# Patient Record
Sex: Female | Born: 1960 | Race: White | Hispanic: No | Marital: Married | Smoking: Former smoker
Health system: Southern US, Community
[De-identification: ages and names within clinical notes are randomized; demographics above are authoritative.]

## PROBLEM LIST (undated history)

## (undated) DIAGNOSIS — N63 Unspecified lump in unspecified breast: Secondary | ICD-10-CM

## (undated) DIAGNOSIS — T7840XA Allergy, unspecified, initial encounter: Secondary | ICD-10-CM

## (undated) HISTORY — PX: TONSILECTOMY/ADENOIDECTOMY WITH MYRINGOTOMY: SHX6125

## (undated) HISTORY — DX: Allergy, unspecified, initial encounter: T78.40XA

## (undated) HISTORY — PX: OTHER SURGICAL HISTORY: SHX169

## (undated) HISTORY — PX: BREAST CYST EXCISION: SHX579

## (undated) HISTORY — DX: Unspecified lump in unspecified breast: N63.0

## (undated) HISTORY — PX: DILATION AND CURETTAGE OF UTERUS: SHX78

---

## 1997-09-14 ENCOUNTER — Ambulatory Visit (HOSPITAL_COMMUNITY): Admission: RE | Admit: 1997-09-14 | Discharge: 1997-09-14 | Payer: Self-pay | Admitting: Obstetrics and Gynecology

## 1998-06-04 ENCOUNTER — Other Ambulatory Visit: Admission: RE | Admit: 1998-06-04 | Discharge: 1998-06-04 | Payer: Self-pay | Admitting: Obstetrics and Gynecology

## 1998-12-16 ENCOUNTER — Encounter: Payer: Self-pay | Admitting: Obstetrics and Gynecology

## 1998-12-16 ENCOUNTER — Observation Stay (HOSPITAL_COMMUNITY): Admission: AD | Admit: 1998-12-16 | Discharge: 1998-12-17 | Payer: Self-pay | Admitting: Obstetrics and Gynecology

## 1998-12-25 ENCOUNTER — Inpatient Hospital Stay (HOSPITAL_COMMUNITY): Admission: AD | Admit: 1998-12-25 | Discharge: 1998-12-26 | Payer: Self-pay | Admitting: Obstetrics and Gynecology

## 1999-01-28 ENCOUNTER — Other Ambulatory Visit: Admission: RE | Admit: 1999-01-28 | Discharge: 1999-01-28 | Payer: Self-pay | Admitting: Obstetrics and Gynecology

## 2001-02-21 ENCOUNTER — Other Ambulatory Visit: Admission: RE | Admit: 2001-02-21 | Discharge: 2001-02-21 | Payer: Self-pay | Admitting: Obstetrics and Gynecology

## 2002-03-13 ENCOUNTER — Other Ambulatory Visit: Admission: RE | Admit: 2002-03-13 | Discharge: 2002-03-13 | Payer: Self-pay | Admitting: Obstetrics and Gynecology

## 2003-03-20 ENCOUNTER — Other Ambulatory Visit: Admission: RE | Admit: 2003-03-20 | Discharge: 2003-03-20 | Payer: Self-pay | Admitting: Obstetrics and Gynecology

## 2004-01-28 ENCOUNTER — Ambulatory Visit: Payer: Self-pay | Admitting: Internal Medicine

## 2004-09-22 ENCOUNTER — Ambulatory Visit: Payer: Self-pay | Admitting: Internal Medicine

## 2004-10-01 ENCOUNTER — Ambulatory Visit: Payer: Self-pay | Admitting: Internal Medicine

## 2004-10-15 ENCOUNTER — Ambulatory Visit: Payer: Self-pay | Admitting: Family Medicine

## 2004-11-09 ENCOUNTER — Ambulatory Visit: Payer: Self-pay | Admitting: Internal Medicine

## 2004-11-20 ENCOUNTER — Ambulatory Visit: Payer: Self-pay | Admitting: Internal Medicine

## 2004-12-01 ENCOUNTER — Encounter: Admission: RE | Admit: 2004-12-01 | Discharge: 2004-12-01 | Payer: Self-pay | Admitting: Internal Medicine

## 2005-01-18 ENCOUNTER — Ambulatory Visit: Payer: Self-pay | Admitting: Family Medicine

## 2005-05-13 ENCOUNTER — Encounter: Admission: RE | Admit: 2005-05-13 | Discharge: 2005-05-13 | Payer: Self-pay | Admitting: Obstetrics and Gynecology

## 2005-12-03 ENCOUNTER — Encounter: Admission: RE | Admit: 2005-12-03 | Discharge: 2005-12-03 | Payer: Self-pay | Admitting: Obstetrics and Gynecology

## 2006-04-15 ENCOUNTER — Ambulatory Visit: Payer: Self-pay | Admitting: Internal Medicine

## 2006-05-05 ENCOUNTER — Ambulatory Visit: Payer: Self-pay | Admitting: Internal Medicine

## 2006-05-17 ENCOUNTER — Encounter: Admission: RE | Admit: 2006-05-17 | Discharge: 2006-05-17 | Payer: Self-pay | Admitting: Internal Medicine

## 2007-02-15 ENCOUNTER — Encounter: Payer: Self-pay | Admitting: Internal Medicine

## 2007-02-15 ENCOUNTER — Ambulatory Visit: Payer: Self-pay | Admitting: Internal Medicine

## 2007-04-03 ENCOUNTER — Ambulatory Visit: Payer: Self-pay | Admitting: Internal Medicine

## 2007-04-03 LAB — CONVERTED CEMR LAB
ALT: 15 units/L (ref 0–35)
Alkaline Phosphatase: 57 units/L (ref 39–117)
BUN: 16 mg/dL (ref 6–23)
Basophils Relative: 0 % (ref 0.0–1.0)
Bilirubin Urine: NEGATIVE
Blood in Urine, dipstick: NEGATIVE
CO2: 27 meq/L (ref 19–32)
Calcium: 9.1 mg/dL (ref 8.4–10.5)
Chloride: 102 meq/L (ref 96–112)
Creatinine, Ser: 0.8 mg/dL (ref 0.4–1.2)
Glucose, Urine, Semiquant: NEGATIVE
HDL: 82 mg/dL (ref >39.0)
Hemoglobin: 13.4 g/dL (ref 12.0–15.0)
LDL Cholesterol: 76 mg/dL (ref 0–99)
Monocytes Relative: 6.2 % (ref 3.0–11.0)
Platelets: 286 10*3/uL (ref 150–400)
RDW: 11.6 % (ref 11.5–14.6)
Specific Gravity, Urine: 1.02
Total Bilirubin: 0.6 mg/dL (ref 0.3–1.2)
Total Protein: 6.2 g/dL (ref 6.0–8.3)
Triglycerides: 37 mg/dL (ref 0–149)
VLDL: 7 mg/dL (ref 0–40)
WBC Urine, dipstick: NEGATIVE
pH: 7

## 2007-04-10 ENCOUNTER — Ambulatory Visit: Payer: Self-pay | Admitting: Internal Medicine

## 2007-04-10 DIAGNOSIS — J309 Allergic rhinitis, unspecified: Secondary | ICD-10-CM | POA: Insufficient documentation

## 2007-04-10 DIAGNOSIS — M949 Disorder of cartilage, unspecified: Secondary | ICD-10-CM

## 2007-04-10 DIAGNOSIS — M899 Disorder of bone, unspecified: Secondary | ICD-10-CM | POA: Insufficient documentation

## 2007-04-10 DIAGNOSIS — N63 Unspecified lump in unspecified breast: Secondary | ICD-10-CM

## 2007-04-10 HISTORY — DX: Unspecified lump in unspecified breast: N63.0

## 2007-04-10 HISTORY — DX: Allergic rhinitis, unspecified: J30.9

## 2007-04-10 LAB — CONVERTED CEMR LAB: Vit D, 1,25-Dihydroxy: 48 (ref 30–89)

## 2007-04-28 ENCOUNTER — Ambulatory Visit: Payer: Self-pay | Admitting: Internal Medicine

## 2007-04-28 LAB — CONVERTED CEMR LAB: Rapid Strep: NEGATIVE

## 2007-04-29 ENCOUNTER — Encounter: Payer: Self-pay | Admitting: Internal Medicine

## 2007-05-01 ENCOUNTER — Ambulatory Visit: Payer: Self-pay | Admitting: Internal Medicine

## 2007-05-04 ENCOUNTER — Telehealth (INDEPENDENT_AMBULATORY_CARE_PROVIDER_SITE_OTHER): Payer: Self-pay | Admitting: *Deleted

## 2007-05-09 ENCOUNTER — Telehealth (INDEPENDENT_AMBULATORY_CARE_PROVIDER_SITE_OTHER): Payer: Self-pay | Admitting: *Deleted

## 2007-05-10 ENCOUNTER — Encounter: Admission: RE | Admit: 2007-05-10 | Discharge: 2007-05-10 | Payer: Self-pay | Admitting: Internal Medicine

## 2007-05-12 ENCOUNTER — Encounter: Payer: Self-pay | Admitting: Internal Medicine

## 2007-06-13 ENCOUNTER — Encounter: Admission: RE | Admit: 2007-06-13 | Discharge: 2007-06-13 | Payer: Self-pay | Admitting: Internal Medicine

## 2007-07-03 ENCOUNTER — Ambulatory Visit: Payer: Self-pay | Admitting: Internal Medicine

## 2007-07-03 DIAGNOSIS — K589 Irritable bowel syndrome without diarrhea: Secondary | ICD-10-CM

## 2007-07-03 DIAGNOSIS — K219 Gastro-esophageal reflux disease without esophagitis: Secondary | ICD-10-CM | POA: Insufficient documentation

## 2007-07-03 HISTORY — DX: Irritable bowel syndrome, unspecified: K58.9

## 2007-07-17 ENCOUNTER — Encounter: Payer: Self-pay | Admitting: Internal Medicine

## 2007-07-20 ENCOUNTER — Encounter (INDEPENDENT_AMBULATORY_CARE_PROVIDER_SITE_OTHER): Payer: Self-pay | Admitting: Surgery

## 2007-07-20 ENCOUNTER — Ambulatory Visit (HOSPITAL_BASED_OUTPATIENT_CLINIC_OR_DEPARTMENT_OTHER): Admission: RE | Admit: 2007-07-20 | Discharge: 2007-07-20 | Payer: Self-pay | Admitting: Surgery

## 2007-07-31 ENCOUNTER — Encounter: Payer: Self-pay | Admitting: Internal Medicine

## 2007-10-11 ENCOUNTER — Telehealth (INDEPENDENT_AMBULATORY_CARE_PROVIDER_SITE_OTHER): Payer: Self-pay | Admitting: *Deleted

## 2007-12-28 ENCOUNTER — Ambulatory Visit: Payer: Self-pay | Admitting: Internal Medicine

## 2008-01-09 ENCOUNTER — Ambulatory Visit: Payer: Self-pay | Admitting: Internal Medicine

## 2008-06-24 ENCOUNTER — Encounter: Admission: RE | Admit: 2008-06-24 | Discharge: 2008-06-24 | Payer: Self-pay | Admitting: Internal Medicine

## 2008-10-22 ENCOUNTER — Ambulatory Visit: Payer: Self-pay | Admitting: Internal Medicine

## 2008-10-22 LAB — CONVERTED CEMR LAB
ALT: 16 units/L (ref 0–35)
Basophils Absolute: 0 10*3/uL (ref 0.0–0.1)
Bilirubin, Direct: 0.1 mg/dL (ref 0.0–0.3)
CO2: 31 meq/L (ref 19–32)
Eosinophils Absolute: 0.4 10*3/uL (ref 0.0–0.7)
Eosinophils Relative: 9 % — ABNORMAL HIGH (ref 0.0–5.0)
FSH: 84.4 milliintl units/mL
GFR calc non Af Amer: 70.33 mL/min (ref >60)
Glucose, Bld: 91 mg/dL (ref 70–99)
HDL: 78.5 mg/dL (ref >39.00)
Leukocytes, UA: NEGATIVE
MCV: 94.8 fL (ref 78.0–100.0)
Monocytes Absolute: 0.3 10*3/uL (ref 0.1–1.0)
Neutrophils Relative %: 39.8 % — ABNORMAL LOW (ref 43.0–77.0)
Nitrite: NEGATIVE
Platelets: 266 10*3/uL (ref 150.0–400.0)
Potassium: 4.8 meq/L (ref 3.5–5.1)
RDW: 11.3 % — ABNORMAL LOW (ref 11.5–14.6)
Sodium: 144 meq/L (ref 135–145)
Specific Gravity, Urine: 1.01 (ref 1.000–1.030)
Total Bilirubin: 0.8 mg/dL (ref 0.3–1.2)
Urobilinogen, UA: 0.2 (ref 0.0–1.0)
VLDL: 8.2 mg/dL (ref 0.0–40.0)
WBC: 4.5 10*3/uL (ref 4.5–10.5)
pH: 7.5 (ref 5.0–8.0)

## 2008-11-05 ENCOUNTER — Ambulatory Visit: Payer: Self-pay | Admitting: Internal Medicine

## 2008-12-03 ENCOUNTER — Telehealth: Payer: Self-pay | Admitting: Internal Medicine

## 2009-06-25 ENCOUNTER — Encounter: Admission: RE | Admit: 2009-06-25 | Discharge: 2009-06-25 | Payer: Self-pay | Admitting: Obstetrics and Gynecology

## 2009-07-18 ENCOUNTER — Encounter: Payer: Self-pay | Admitting: Internal Medicine

## 2009-07-18 ENCOUNTER — Ambulatory Visit: Payer: Self-pay | Admitting: Internal Medicine

## 2010-04-05 ENCOUNTER — Encounter: Payer: Self-pay | Admitting: Internal Medicine

## 2010-04-14 NOTE — Miscellaneous (Signed)
Summary: BONE DENSITY  Clinical Lists Changes  Orders: Added new Test order of T-Bone Densitometry (77080) - Signed Added new Test order of T-Lumbar Vertebral Assessment (77082) - Signed 

## 2010-06-09 ENCOUNTER — Other Ambulatory Visit: Payer: Self-pay | Admitting: Internal Medicine

## 2010-06-09 DIAGNOSIS — Z1231 Encounter for screening mammogram for malignant neoplasm of breast: Secondary | ICD-10-CM

## 2010-06-29 ENCOUNTER — Ambulatory Visit
Admission: RE | Admit: 2010-06-29 | Discharge: 2010-06-29 | Disposition: A | Discharge status: Home / Self Care | Payer: Federal, State, Local not specified - PPO | Source: Ambulatory Visit | Attending: Internal Medicine | Admitting: Internal Medicine

## 2010-06-29 DIAGNOSIS — Z1231 Encounter for screening mammogram for malignant neoplasm of breast: Secondary | ICD-10-CM

## 2010-07-28 NOTE — Op Note (Signed)
NAME:  Joan Edwards, Joan Edwards NO.:  [id number]   MEDICAL RECORD NO.:  [id number]          PATIENT TYPE:  AMB   LOCATION:  DSC                          FACILITY:  [facility]   PHYSICIAN:  Currie Paris, M.D.DATE OF BIRTH:  02-25-1961   DATE OF PROCEDURE:  07/20/2007  DATE OF DISCHARGE:                               OPERATIVE REPORT   PREOPERATIVE DIAGNOSIS:  Left breast mass.   POSTOPERATIVE DIAGNOSIS:  Left breast mass.   OPERATION:  Excisional biopsy left breast mass.   SURGEON:  Currie Paris, M.D.   ANESTHESIA:  General.   CLINICAL HISTORY:  This is a 50 year old lady with a small soft mass in  the upper outer quadrant of left breast really at the edge of the  pectoralis and lower axilla.  Clinically, it was benign.  It was not  apparent on diagnostic imaging.  Initially, we elected to follow this,  but the patient became anxious and wished to have this removed.   DESCRIPTION OF THE PROCEDURE:  The patient was seen in the holding area,  and she had no further questions.  We confirmed that excision of her  left breast mass was the planned procedure and we both initialled the  left breast as the operative site.   The patient was taken to the operating room and before being given any  IV sedation she was able to locate the mass as I was and I was able to  mark the exact location fairly anterior low axilla.  Again, this was  felt to be clinically benign, and I thought perhaps a lipoma.   After satisfactory general (LMA), anesthesia had been obtained.  The  breast was prepped and draped and the time-out was performed.   I made a short incision directly over the mass and did some general  dissection and found a well-circumscribed lipoma appearing mass that was  excised in toto.  With that out careful palpation, bimanually with my  one finger in the incision and one on the skin and then deeper into the  axilla revealed no other mass and I think this was  consistent with  malignant feeling.  There really did not appear to be breast tissue in  this area, but only fatty accessory tissue.   After everything was tried and I was sure we gotten the mass in question  out, I went ahead and infiltrate this with some 0.25% plain Marcaine.  The incision was closed with 3-0 Vicryl, 4-0 Monocryl subcuticular, and  Dermabond on the skin.   The patient tolerated the procedure well, and there were no  complications.  All counts were correct.      Currie Paris, M.D.  Electronically Signed     CJS/MEDQ  D:  07/20/2007  T:  07/20/2007  Job:  [id number]   cc:   Stacie Glaze, MD

## 2010-08-25 ENCOUNTER — Other Ambulatory Visit: Payer: Self-pay | Admitting: Obstetrics and Gynecology

## 2011-06-01 ENCOUNTER — Other Ambulatory Visit: Payer: Self-pay | Admitting: Obstetrics and Gynecology

## 2011-06-01 DIAGNOSIS — Z1231 Encounter for screening mammogram for malignant neoplasm of breast: Secondary | ICD-10-CM

## 2011-07-01 ENCOUNTER — Ambulatory Visit
Admission: RE | Admit: 2011-07-01 | Discharge: 2011-07-01 | Disposition: A | Discharge status: Home / Self Care | Payer: Federal, State, Local not specified - PPO | Source: Ambulatory Visit | Attending: Obstetrics and Gynecology | Admitting: Obstetrics and Gynecology

## 2011-07-01 DIAGNOSIS — Z1231 Encounter for screening mammogram for malignant neoplasm of breast: Secondary | ICD-10-CM

## 2011-09-24 ENCOUNTER — Telehealth: Payer: Self-pay | Admitting: Internal Medicine

## 2011-09-24 NOTE — Telephone Encounter (Signed)
Open in error

## 2011-12-10 ENCOUNTER — Other Ambulatory Visit (INDEPENDENT_AMBULATORY_CARE_PROVIDER_SITE_OTHER): Payer: Federal, State, Local not specified - PPO

## 2011-12-10 DIAGNOSIS — Z0000 Encounter for general adult medical examination without abnormal findings: Secondary | ICD-10-CM

## 2011-12-10 LAB — HEPATIC FUNCTION PANEL
ALT: 14 U/L (ref 0–35)
AST: 18 U/L (ref 0–37)
Alkaline Phosphatase: 58 U/L (ref 39–117)
Total Bilirubin: 0.6 mg/dL (ref 0.3–1.2)

## 2011-12-10 LAB — CBC WITH DIFFERENTIAL/PLATELET
Basophils Absolute: 0 10*3/uL (ref 0.0–0.1)
Eosinophils Relative: 6.6 % — ABNORMAL HIGH (ref 0.0–5.0)
HCT: 38.7 % (ref 36.0–46.0)
Hemoglobin: 12.7 g/dL (ref 12.0–15.0)
Lymphocytes Relative: 47 % — ABNORMAL HIGH (ref 12.0–46.0)
Lymphs Abs: 2.1 10*3/uL (ref 0.7–4.0)
Monocytes Relative: 5.5 % (ref 3.0–12.0)
Neutro Abs: 1.8 10*3/uL (ref 1.4–7.7)
Platelets: 274 10*3/uL (ref 150.0–400.0)
WBC: 4.5 10*3/uL (ref 4.5–10.5)

## 2011-12-10 LAB — LIPID PANEL
HDL: 91 mg/dL (ref >39.00)
Total CHOL/HDL Ratio: 2
VLDL: 7.2 mg/dL (ref 0.0–40.0)

## 2011-12-10 LAB — BASIC METABOLIC PANEL
Calcium: 9.2 mg/dL (ref 8.4–10.5)
GFR: 83.17 mL/min (ref >60.00)
Potassium: 4.9 mEq/L (ref 3.5–5.1)
Sodium: 141 mEq/L (ref 135–145)

## 2011-12-10 LAB — TSH: TSH: 2.07 u[IU]/mL (ref 0.35–5.50)

## 2011-12-17 ENCOUNTER — Ambulatory Visit (INDEPENDENT_AMBULATORY_CARE_PROVIDER_SITE_OTHER): Payer: Federal, State, Local not specified - PPO | Admitting: Internal Medicine

## 2011-12-17 ENCOUNTER — Encounter: Payer: Self-pay | Admitting: Internal Medicine

## 2011-12-17 VITALS — BP 120/70 | HR 72 | Temp 98.3°F | Resp 16 | Ht 64.0 in | Wt 136.0 lb

## 2011-12-17 DIAGNOSIS — Z0000 Encounter for general adult medical examination without abnormal findings: Secondary | ICD-10-CM

## 2011-12-17 NOTE — Progress Notes (Signed)
Subjective:    Patient ID: Joan Edwards, female    DOB: 03/12/1961, 51 y.o.   MRN: [id number]  HPI CPX   Review of Systems  Constitutional: Negative for activity change, appetite change and fatigue.  HENT: Negative for ear pain, congestion, neck pain, postnasal drip and sinus pressure.   Eyes: Negative for redness and visual disturbance.  Respiratory: Negative for cough, shortness of breath and wheezing.   Gastrointestinal: Negative for abdominal pain and abdominal distention.  Genitourinary: Negative for dysuria, frequency and menstrual problem.  Musculoskeletal: Negative for myalgias, joint swelling and arthralgias.  Skin: Negative for rash and wound.  Neurological: Negative for dizziness, weakness and headaches.  Hematological: Negative for adenopathy. Does not bruise/bleed easily.  Psychiatric/Behavioral: Negative for disturbed wake/sleep cycle and decreased concentration.   No past medical history on file.  History   Social History  . Marital Status: Married    Spouse Name: N/A    Number of Children: N/A  . Years of Education: N/A   Occupational History  . Not on file.   Social History Main Topics  . Smoking status: Not on file  . Smokeless tobacco: Not on file  . Alcohol Use: Not on file  . Drug Use: Not on file  . Sexually Active: Not on file   Other Topics Concern  . Not on file   Social History Narrative  . No narrative on file    No past surgical history on file.  No family history on file.  Allergies  Allergen Reactions  . Erythromycin     REACTION: Upset GI  . Sulfonamide Derivatives     REACTION: itching    No current outpatient prescriptions on file prior to visit.    BP 120/70  Pulse 72  Temp 98.3 F (36.8 C)  Resp 16  Ht 5\' 4"  (1.626 m)  Wt 136 lb (61.689 kg)  BMI 23.34 kg/m2       Objective:   Physical Exam  Nursing note and vitals reviewed. Constitutional: She is oriented to person, place, and time. She appears  well-developed and well-nourished. No distress.  HENT:  Head: Normocephalic and atraumatic.  Right Ear: External ear normal.  Left Ear: External ear normal.  Nose: Nose normal.  Mouth/Throat: Oropharynx is clear and moist.  Eyes: Conjunctivae normal and EOM are normal. Pupils are equal, round, and reactive to light.  Neck: Normal range of motion. Neck supple. No JVD present. No tracheal deviation present. No thyromegaly present.  Cardiovascular: Normal rate, regular rhythm, normal heart sounds and intact distal pulses.   No murmur heard. Pulmonary/Chest: Effort normal and breath sounds normal. She has no wheezes. She exhibits no tenderness.  Abdominal: Soft. Bowel sounds are normal.  Musculoskeletal: Normal range of motion. She exhibits no edema and no tenderness.  Lymphadenopathy:    She has no cervical adenopathy.  Neurological: She is alert and oriented to person, place, and time. She has normal reflexes. No cranial nerve deficit.  Skin: Skin is warm and dry. She is not diaphoretic.  Psychiatric: She has a normal mood and affect. Her behavior is normal.          Assessment & Plan:   This is a routine physical examination for this healthy  Female. Reviewed all health maintenance protocols including mammography colonoscopy bone density and reviewed appropriate screening labs. Her immunization history was reviewed as well as her current medications and allergies refills of her chronic medications were given and the plan for yearly health maintenance  was discussed all orders and referrals were made as appropriate.

## 2012-03-01 ENCOUNTER — Ambulatory Visit (INDEPENDENT_AMBULATORY_CARE_PROVIDER_SITE_OTHER): Payer: Federal, State, Local not specified - PPO | Admitting: Family Medicine

## 2012-03-01 ENCOUNTER — Encounter: Payer: Self-pay | Admitting: Family Medicine

## 2012-03-01 VITALS — BP 100/62 | Temp 98.5°F | Wt 140.0 lb

## 2012-03-01 DIAGNOSIS — R52 Pain, unspecified: Secondary | ICD-10-CM

## 2012-03-01 DIAGNOSIS — B9789 Other viral agents as the cause of diseases classified elsewhere: Secondary | ICD-10-CM

## 2012-03-01 DIAGNOSIS — B349 Viral infection, unspecified: Secondary | ICD-10-CM

## 2012-03-01 LAB — POCT INFLUENZA A/B
Influenza A, POC: NEGATIVE
Influenza B, POC: NEGATIVE

## 2012-03-01 NOTE — Patient Instructions (Addendum)
Viral Syndrome  You or your child has Viral Syndrome. It is the most common infection causing "colds" and infections in the nose, throat, sinuses, and breathing tubes. Sometimes the infection causes nausea, vomiting, or diarrhea. The germ that causes the infection is a virus. No antibiotic or other medicine will kill it. There are medicines that you can take to make you or your child more comfortable.   HOME CARE INSTRUCTIONS    Rest in bed until you start to feel better.   If you have diarrhea or vomiting, eat small amounts of crackers and toast. Soup is helpful.   Do not give aspirin or medicine that contains aspirin to children.   Only take over-the-counter or prescription medicines for pain, discomfort, or fever as directed by your caregiver.  SEEK IMMEDIATE MEDICAL CARE IF:    You or your child has not improved within one week.   You or your child has pain that is not at least partially relieved by over-the-counter medicine.   Thick, colored mucus or blood is coughed up.   Discharge from the nose becomes thick yellow or green.   Diarrhea or vomiting gets worse.   There is any major change in your or your child's condition.   You or your child develops a skin rash, stiff neck, severe headache, or are unable to hold down food or fluid.   You or your child has an oral temperature above 102 F (38.9 C), not controlled by medicine.   Your baby is older than 3 months with a rectal temperature of 102 F (38.9 C) or higher.   Your baby is 3 months old or younger with a rectal temperature of 100.4 F (38 C) or higher.  Document Released: 02/14/2006 Document Revised: 05/24/2011 Document Reviewed: 02/15/2007  ExitCare Patient Information 2013 [employer].

## 2012-03-01 NOTE — Progress Notes (Signed)
  Subjective:    Patient ID: Joan Edwards, female    DOB: 1961/02/24, 51 y.o.   MRN: [id number]  HPI  Acute visit. Onset yesterday of cough, raspy voice, chills. Mild sore throat. No confirmed fever. No headaches. Diffuse body aches. Husband with similar symptoms. No nausea or vomiting. 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 Nonsmoker   Review of Systems  Constitutional: Positive for chills. Negative for fever.  HENT: Positive for congestion and sore throat.   Respiratory: Positive for cough.   Skin: Negative for rash.  Neurological: Negative for headaches.       Objective:   Physical Exam  Constitutional: She appears well-developed and well-nourished.  HENT:  Right Ear: External ear normal.  Left Ear: External ear normal.  Mouth/Throat: Oropharynx is clear and moist.  Neck: Neck supple.  Cardiovascular: Normal rate and regular rhythm.   Pulmonary/Chest: Effort normal and breath sounds normal. No respiratory distress. She has no wheezes. She has no rales.  Lymphadenopathy:    She has no cervical adenopathy.          Assessment & Plan:  Viral syndrome. Influenza screen negative. Treat symptomatically.

## 2012-03-09 ENCOUNTER — Telehealth: Payer: Self-pay | Admitting: Internal Medicine

## 2012-03-09 MED ORDER — AZITHROMYCIN 250 MG PO TABS: ORAL_TABLET | ORAL | Status: DC

## 2012-03-09 NOTE — Telephone Encounter (Signed)
Look at chart and realized pt seen Dr Caryl Never for the virus.  Talked it over with Dr Caryl Never and per Dr Caryl Never ok to call in a zpack use as directed for 5 days no refills.  Called pt back and apologized for the confusion.  Pt said to call in to [facility].  Rx sent in electronically, pt aware

## 2012-03-09 NOTE — Addendum Note (Signed)
Addended by: Alfred Levins D on: 03/09/2012 05:23 PM   Modules accepted: Orders

## 2012-03-09 NOTE — Telephone Encounter (Signed)
Made pt aware that Dr Lovell Sheehan was not in the office this week and we could not call in antibiotic without her being seen.  Pt stated that her brother is a dr and she will have him call her something in.  She was upset and said she should of been told that by the triage nurse.  She stated if her brother wouldn't call her in anything then she would call tomorrow and schedule and OV

## 2012-03-09 NOTE — Telephone Encounter (Signed)
Patient Information:  Caller Name: Elizabethanne  Phone: [phone]  Patient: Joan Edwards, Joan Edwards  Gender: Female  DOB: 1960/10/29  Age: 51 Years  PCP: Darryll Capers (Adults only)  Pregnant: No  Office Follow Up:  Does the office need to follow up with this patient?: Yes  Instructions For The Office: Request antibiotics. Going out of town. Declined appointment.  RN Note:  Patient states tomorrow 03/10/12 will be 10 days. Advised I will schedule appt for tomorrow. Declined. Wants medication called into her pharmacy. They are going out of town for a week. She states she needs antibiotic at the point. Generalized Nausea/coughing.  Pharmacy [phone]  Symptoms  Reason For Call & Symptoms: Patient in the office on 03/01/12 seen by Dr. Caryl Never and diagnosed Viral Syndrome. (Husband had the same thing)   Flu swab negative. In bed four days.   Patient states ongoing  +Cough productive white, Afebrile , constant drainage down back of throat.  Persistant  +nausea x several days , no vomiting/diarrhea, tired.  Reviewed Health History In EMR: Yes  Reviewed Medications In EMR: Yes  Reviewed Allergies In EMR: Yes  Reviewed Surgeries / Procedures: No  Date of Onset of Symptoms: 03/01/2012  Treatments Tried: Clairtin occasionally. Advil  Treatments Tried Worked: No OB / GYN:  LMP: Unknown  Guideline(s) Used:  Cough  Disposition Per Guideline:   Home Care  Reason For Disposition Reached:   Cough with cold symptoms (e.g., runny nose, postnasal drip, throat clearing)  Advice Given:  Reassurance  Coughing is the way that our lungs remove irritants and mucus. It helps protect our lungs from getting pneumonia.  You can get a dry hacking cough after a chest cold. Sometimes this type of cough can last 1-3 weeks, and be worse at night.  You can also get a cough after being exposed to irritating substances like smoke, strong perfumes, and dust.  Here is some care advice that should help.  Cough Medicines:  OTC Cough Syrups: The most common cough suppressant in OTC cough medications is dextromethorphan. Often the letters "DM" appear in the name.  Home Remedy - Hard Candy: Hard candy works just as well as medicine-flavored OTC cough drops. Diabetics should use sugar-free candy.  Home Remedy - Honey: This old home remedy has been shown to help decrease coughing at night. The adult dosage is 2 teaspoons (10 ml) at bedtime. Honey should not be given to infants under one year of age.  Prevent Dehydration:  Drink adequate liquids.  This will help soothe an irritated or dry throat and loosen up the phlegm.  Avoid Tobacco Smoke:  Smoking or being exposed to smoke makes coughs much worse.  Call Back If:  Difficulty breathing  Cough lasts more than 3 weeks  Fever lasts > 3 days  You become worse.  Patient Refused Recommendation:  Patient Requests Prescription  Request antibiotic

## 2012-05-26 ENCOUNTER — Other Ambulatory Visit: Payer: Self-pay

## 2012-05-26 DIAGNOSIS — Z1231 Encounter for screening mammogram for malignant neoplasm of breast: Secondary | ICD-10-CM

## 2012-07-03 ENCOUNTER — Ambulatory Visit
Admission: RE | Admit: 2012-07-03 | Discharge: 2012-07-03 | Disposition: A | Discharge status: Home / Self Care | Payer: Federal, State, Local not specified - PPO | Source: Ambulatory Visit

## 2012-07-03 DIAGNOSIS — Z1231 Encounter for screening mammogram for malignant neoplasm of breast: Secondary | ICD-10-CM

## 2013-06-20 ENCOUNTER — Other Ambulatory Visit: Payer: Self-pay

## 2013-06-20 DIAGNOSIS — Z1231 Encounter for screening mammogram for malignant neoplasm of breast: Secondary | ICD-10-CM

## 2013-07-10 ENCOUNTER — Ambulatory Visit
Admission: RE | Admit: 2013-07-10 | Discharge: 2013-07-10 | Disposition: A | Discharge status: Home / Self Care | Payer: Federal, State, Local not specified - PPO | Source: Ambulatory Visit

## 2013-07-10 ENCOUNTER — Encounter (INDEPENDENT_AMBULATORY_CARE_PROVIDER_SITE_OTHER): Payer: Self-pay

## 2013-07-10 DIAGNOSIS — Z1231 Encounter for screening mammogram for malignant neoplasm of breast: Secondary | ICD-10-CM

## 2014-01-24 ENCOUNTER — Encounter: Payer: Self-pay | Admitting: Family Medicine

## 2014-01-24 ENCOUNTER — Ambulatory Visit (INDEPENDENT_AMBULATORY_CARE_PROVIDER_SITE_OTHER): Payer: Federal, State, Local not specified - PPO | Admitting: Family Medicine

## 2014-01-24 VITALS — BP 116/70 | HR 72 | Temp 97.8°F | Ht 64.0 in | Wt 142.8 lb

## 2014-01-24 DIAGNOSIS — J320 Chronic maxillary sinusitis: Secondary | ICD-10-CM

## 2014-01-24 DIAGNOSIS — Z1211 Encounter for screening for malignant neoplasm of colon: Secondary | ICD-10-CM

## 2014-01-24 MED ORDER — AMOXICILLIN 875 MG PO TABS: 875.0000 mg | ORAL_TABLET | Freq: Two times a day (BID) | ORAL | Status: DC

## 2014-01-24 NOTE — Progress Notes (Signed)
HPI:  Joan Edwards is here to establish care. Used to see Dr. Lovell SheehanJenkins. Last PCP and physical: sees Dr. Shella Spearingavoon in gyn for physicals - has this next week.  Has the following chronic problems that require follow up and concerns today:   URI: -started 3 weeks ago -symptoms: nasal congestion, achy initially, cough, PND, nausea - improving but the cough has lingered and she started feeling worse yesterday, sore throat, nasal congestion, chills Denies: fever, tooth pain, sob, ear pain  Hx of IBS: -crampy abd pain with stress her whole life -on medication for GERD in the past, but does not take now -gas-x helps  Wants referral for colonosocpy  ROS negative for unless reported above: fevers, unintentional weight loss, hearing or vision loss, chest pain, palpitations, struggling to breath, hemoptysis, melena, hematochezia, hematuria, falls, loc, si, thoughts of self harm  Past Medical History  Diagnosis Date  . BREAST MASS, LEFT 04/10/2007    Qualifier: Diagnosis of  By: Lovell SheehanJenkins MD, Balinda QuailsJohn E     Past Surgical History  Procedure Laterality Date  . Dilation and curettage of uterus    . Tonsil      Family History  Problem Relation Age of Onset  . Hypertension Mother   . Hypertension Father     History   Social History  . Marital Status: Married    Spouse Name: N/A    Number of Children: N/A  . Years of Education: N/A   Social History Main Topics  . Smoking status: Former Games developermoker  . Smokeless tobacco: None     Comment: light smoker for 15 years, quit at age 53  . Alcohol Use: Yes     Comment: occ, 1-2 glass of wine a few times per week  . Drug Use: None  . Sexual Activity: None   Other Topics Concern  . 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      Home Situation: lives with husband and daughter, son      Spiritual Beliefs: Christian      Lifestyle: yoga once per week - more in the summer, walks a few days per week; diet  is fair          Current outpatient prescriptions: Ascorbic Acid (VITAMIN C) 100 MG tablet, Take by mouth daily., Disp: , Rfl: ;  calcium gluconate 500 MG tablet, Take 1 tablet by mouth daily., Disp: , Rfl: ;  cholecalciferol (VITAMIN D) 1000 UNITS tablet, Take 1,000 Units by mouth daily., Disp: , Rfl: ;  glucosamine-chondroitin 500-400 MG tablet, Take 1 tablet by mouth daily., Disp: , Rfl: ;  LECITHIN PO, Take by mouth., Disp: , Rfl:  loratadine (CLARITIN) 10 MG tablet, Take 10 mg by mouth daily., Disp: , Rfl: ;  MAGNESIUM PO, Take by mouth., Disp: , Rfl: ;  Multiple Vitamin (MULTIVITAMIN) capsule, Take 1 capsule by mouth daily., Disp: , Rfl: ;  pseudoephedrine (SUDAFED) 30 MG tablet, Take 30 mg by mouth every 4 (four) hours as needed for congestion., Disp: , Rfl:  amoxicillin (AMOXIL) 875 MG tablet, Take 1 tablet (875 mg total) by mouth 2 (two) times daily., Disp: 20 tablet, Rfl: 0  EXAM:  Filed Vitals:   01/24/14 1619  BP: 116/70  Pulse: 72  Temp: 97.8 F (36.6 C)    Body mass index is 24.5 kg/(m^2).  GENERAL: vitals reviewed and listed above, alert, oriented, appears well hydrated and in no acute distress  HEENT:  atraumatic, conjunttiva clear, no obvious abnormalities on inspection of external nose and ears, normal appearance of ear canals and TMs, clear nasal congestion, mild post oropharyngeal erythema with PND, no tonsillar edema or exudate, no sinus TTP  NECK: no obvious masses on inspection  LUNGS: clear to auscultation bilaterally, no wheezes, rales or rhonchi, good air movement  CV: HRRR, no peripheral edema  MS: moves all extremities without noticeable abnormality  PSYCH: pleasant and cooperative, no obvious depression or anxiety  ASSESSMENT AND PLAN:  Discussed the following assessment and plan:  Colon cancer screening - Plan: Ambulatory referral to Gastroenterology  Maxillary sinusitis, unspecified chronicity - Plan: amoxicillin (AMOXIL) 875 MG tablet   -We  reviewed the PMH, PSH, FH, SH, Meds and Allergies. -We provided refills for any medications we will prescribe as needed. -We addressed current concerns per orders and patient instructions. -We have asked for records for pertinent exams, studies, vaccines and notes from previous providers. -We have advised patient to follow up per instructions below.   -Patient advised to return or notify a doctor immediately if symptoms worsen or persist or new concerns arise.  There are no Patient Instructions on file for this visit.   Kriste BasqueKIM, Aiyonna Lucado R.

## 2014-01-24 NOTE — Progress Notes (Signed)
Pre visit review using our clinic review tool, if applicable. No additional management support is needed unless otherwise documented below in the visit note. 

## 2014-04-08 ENCOUNTER — Other Ambulatory Visit: Payer: Self-pay | Admitting: *Deleted

## 2014-04-08 ENCOUNTER — Telehealth: Payer: Self-pay | Admitting: Family Medicine

## 2014-04-08 DIAGNOSIS — E079 Disorder of thyroid, unspecified: Secondary | ICD-10-CM

## 2014-04-08 NOTE — Telephone Encounter (Signed)
Would advise repeat TSH and free T4 lab appt here in a few weeks. Please help her to schedule. Thank you.

## 2014-04-08 NOTE — Telephone Encounter (Signed)
Patient informed and states she will call back for the lab appt in 2 weeks.

## 2014-04-08 NOTE — Telephone Encounter (Signed)
Pt caleld to say that she had lab work done at Dr Loreta AveMann office and her thyroid level was high.Dr Loreta AveMann office will send lab results for Dr Selena BattenKim to review. Pt request a call back after lab results are reviewed.

## 2014-04-24 ENCOUNTER — Other Ambulatory Visit (INDEPENDENT_AMBULATORY_CARE_PROVIDER_SITE_OTHER): Payer: Federal, State, Local not specified - PPO

## 2014-04-24 DIAGNOSIS — E079 Disorder of thyroid, unspecified: Secondary | ICD-10-CM

## 2014-04-25 LAB — T4, FREE: Free T4: 0.81 ng/dL (ref 0.60–1.60)

## 2014-04-25 LAB — TSH: TSH: 3.5 u[IU]/mL (ref 0.35–4.50)

## 2014-05-13 ENCOUNTER — Encounter: Payer: Self-pay | Admitting: Family Medicine

## 2014-05-13 LAB — HM COLONOSCOPY: HM COLON: NORMAL

## 2014-07-15 ENCOUNTER — Telehealth: Payer: Self-pay | Admitting: *Deleted

## 2014-07-15 NOTE — Telephone Encounter (Signed)
PLEASE NOTE: All timestamps contained within this report are represented as [place] Standard Time. CONFIDENTIALTY NOTICE: This fax transmission is intended only for the addressee. It contains information that is legally privileged, confidential or otherwise protected from use or disclosure. If you are not the intended recipient, you are strictly prohibited from reviewing, disclosing, copying using or disseminating any of this information or taking any action in reliance on or regarding this information. If you have received this fax in error, please notify us immediately by telephone so that we can arrange for its return to us. Phone: [phone], Toll-Free: [phone], Fax: [phone] Page: 1 of 3 Call Id: [id number] [facility] - Client TELEPHONE ADVICE RECORD [facility] Patient Name: Joan GuildNNE Hinesley Gender: Female DOB: 1960/08/01 Age: [age] Y 1 M 15 D Return Phone Number: [phone] (Primary), [phone] (Secondary) Address: [address] City[place]State[place]Zip: [place] [id number] Client [facility] - Client Client Site  [facility] Night Physician Kriste BasqueKim, Hannah Contact Type Call Call Type Triage / Clinical Relationship To Patient Self Return Phone Number [phone] (Secondary) Chief Complaint Eye Pus Or Discharge Initial Comment Caller states, thinks she is coming down with pink eye, she is a Manufacturing systems engineerpreschool teacher, 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 If symptomatic, describe symptoms. 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 gritty goopy stuff started yesterday. continues to produce yellow/white drainage after wiping eyes. no cold symptoms. Has the patient traveled out of the  country within the last 30 days? ---Not Applicable Does the patient require triage? ---Yes Related visit to physician within the last 2 weeks? ---No Does the PT have any chronic conditions? (i.e. diabetes, asthma, etc.) ---No Guidelines Guideline Title Affirmed Question Affirmed Notes Nurse Date/Time (Eastern Time) Eye - Pus or Discharge [1] Eye with yellow/green discharge or eyelashes stick together AND [2] PCP standing order to call in antibiotic eye drops (all triage questions negative) Izola PriceMyers, RN, Cala BradfordKimberly 07/13/2014 9:39:24 AM Disp. Time Lamount Cohen(Eastern Time) Disposition Final User 07/13/2014 9:49:28 AM Pharmacy Call Izola PriceMyers, RN, Cala BradfordKimberly [facility] [phone] 07/13/2014 9:49:42 AM Clinical Call Izola PriceMyers, RN, Cala BradfordKimberly 07/13/2014 9:45:06 AM Home Care Yes Izola PriceMyers, RN, Cala BradfordKimberly PLEASE NOTE: All timestamps contained within this report are represented as [place] Standard Time. CONFIDENTIALTY NOTICE: This fax transmission is intended only for the addressee. It contains information that is legally privileged, confidential or otherwise protected from use or disclosure. If you are not the intended recipient, you are strictly prohibited from reviewing, disclosing, copying using or disseminating any of this information or taking any action in reliance on or regarding this information. If you have received this fax in error, please notify us immediately by telephone so that we can arrange for its return to us. Phone: [phone], Toll-Free: [phone], Fax: [phone] Page: 2 of 3 Call Id: [id number] Caller Understands: Yes Disagree/Comply: Comply Care Advice Given Per Guideline HOME CARE: You should be able to treat this at home. REASSURANCE: * Pink-eye responds to home treatment with antibiotic eyedrops and is not harmful to vision. * 'Pink Eye' is a common complication of a cold or it can be acquired from exposure to a child or adult who has had it recently. CONTAGIOUSNESS:  * Pink-eye is contagious. Try not to touch your eyes. Wash your hands frequently. Do  not share towels. * You may return to work or school. Avoid physical contact (e.g. shaking hands) until the symptoms have resolved. EXPECTED COURSE: With treatment, the yellow discharge should clear up in 3 days. The red eyes may persist for several more days. CALL BACK IF: * Pus lasts over 3 days (72 hours) on treatment * More than just mild discomfort * You become worse CARE ADVICE given per Eye - Pus or Discharge (Adult) guideline. Nurse will call in antibiotic drops. Pt is allergic to sulfa so nurse will check with pharmacist to make sure to prescribe drops with no sulfa. After Care Instructions Given Call Event Type User Date / Time Description Standing Orders Preparation Additional Instructions Route Frequency Duration Nurse Comments User Name Polytrim Eye Drops 2 drops both eyes Eye Four Times Daily 5 Days Izola Price, RN, Cala Bradford PLEASE NOTE: All timestamps contained within this report are represented as [place] Standard Time. CONFIDENTIALTY NOTICE: This fax transmission is intended only for the addressee. It contains information that is legally privileged, confidential or otherwise protected from use or disclosure. If you are not the intended recipient, you are strictly prohibited from reviewing, disclosing, copying using or disseminating any of this information or taking any action in reliance on or regarding this information. If you have received this fax in error, please notify us immediately by telephone so that we can arrange for its return to [place]. Phone: [phone], Toll-Free: [phone], Fax: [phone] Page: 3 of 3 Call Id: [id number] Team [facility] [address], [address] [place], [place] [address] [phone] Fax: [phone] MEDICATION ORDER [facility] - Client [facility] - Night Date:  07/13/2014 From: QI Department To: Kriste Basque This is an approved standing order given by our call center nurse on your behalf. Fax to [phone] within 5 business days. Thank you. Date Lamount Cohen Time): 07/13/2014 9:23:23 AM Triage RN: Karen Chafe, RN NAME: Joan Edwards PHONE NUMBER: [phone] (Primary), [phone] (Secondary) BIRTHDATE: 08-29-1960 ADDRESS: [address] CITY/STATE/ZIP: [place] [address] CALLER: Self NAME: Rx Given Preparation Additional Instructions Route Frequency Duration Nurse Comments User Name Polytrim Eye Drops 2 drops both eyes Eye Four Times Daily 5 Days Izola Price, RN, Cala Bradford No signature is required on standing orders.

## 2014-07-16 ENCOUNTER — Telehealth: Payer: Self-pay | Admitting: Family Medicine

## 2014-07-16 NOTE — Telephone Encounter (Signed)
Spoke with Dr. Selena BattenKim who advises pt d/c drops, use warm compresses as most often pink eye is viral, if sxs are no better OV tomorrow, if pt has pain and/or vision changes she needs to see ophthalmology.  Called pt and left message to call back

## 2014-07-16 NOTE — Telephone Encounter (Signed)
Patient Name: Joan GuildNNE Abril  DOB: Oct 31, 1960    Initial Comment Caller states c/o Rx for eye drops for pink eye are making her eyes itch   Nurse Assessment  Nurse: Sherilyn CooterHenry, RN, Thurmond ButtsWade Date/Time (Eastern Time): 07/16/2014 2:16:01 PM  Confirm and document reason for call. If symptomatic, describe symptoms. ---Caller states that she was prescribed Polymycin Sulfate on Saturday for pink eye. She has itching which began Sunday and was worse yesterday. After she puts the drops in, they itch "like crazy" for about an hour or so. The drainage has cleared up except for a little of a cloudy whitish drainage in the corner of her left eye. She wants to know if she needs to stop them or if she may be having an allergic reaction. Does she need different drops. I called the backline and spoke with Okey Regalarol who took the information and states that someone will give her a call back today about this. Caller notified and verbalized understanding.  Has the patient traveled out of the country within the last 30 days? ---Not Applicable  Does the patient require triage? ---No     Guidelines    Guideline Title Affirmed Question Affirmed Notes       Final Disposition User

## 2014-07-16 NOTE — Telephone Encounter (Signed)
Pt aware of Dr. Elmyra RicksKim's message. Pt denies pain and vision changes. She will try warm compresses and call back if no better in 1-2 days

## 2015-03-21 ENCOUNTER — Ambulatory Visit (INDEPENDENT_AMBULATORY_CARE_PROVIDER_SITE_OTHER): Payer: Federal, State, Local not specified - PPO | Admitting: Family Medicine

## 2015-03-21 ENCOUNTER — Encounter: Payer: Self-pay | Admitting: Family Medicine

## 2015-03-21 VITALS — BP 110/64 | HR 85 | Temp 97.7°F | Ht 64.0 in | Wt 147.2 lb

## 2015-03-21 DIAGNOSIS — M546 Pain in thoracic spine: Secondary | ICD-10-CM

## 2015-03-21 NOTE — Progress Notes (Signed)
Pre visit review using our clinic review tool, if applicable. No additional management support is needed unless otherwise documented below in the visit note. 

## 2015-03-21 NOTE — Patient Instructions (Signed)
BEFORE YOU LEAVE: -upper back exercises -xray sheet -physical exam and follow up in 1-2 months  Go get xray  Do the exercises 3-4 days per week and avoid any yoga poses that aggravate this pain
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
## 2015-03-24 ENCOUNTER — Ambulatory Visit (INDEPENDENT_AMBULATORY_CARE_PROVIDER_SITE_OTHER)
Admission: RE | Admit: 2015-03-24 | Discharge: 2015-03-24 | Disposition: A | Discharge status: Home / Self Care | Payer: Federal, State, Local not specified - PPO | Source: Ambulatory Visit | Attending: Family Medicine | Admitting: Family Medicine

## 2015-03-24 DIAGNOSIS — M546 Pain in thoracic spine: Secondary | ICD-10-CM

## 2015-04-01 ENCOUNTER — Other Ambulatory Visit: Payer: Federal, State, Local not specified - PPO

## 2015-04-08 ENCOUNTER — Encounter: Payer: Self-pay | Admitting: Family Medicine

## 2015-04-09 ENCOUNTER — Other Ambulatory Visit: Payer: Self-pay | Admitting: *Deleted

## 2015-04-09 DIAGNOSIS — Z0000 Encounter for general adult medical examination without abnormal findings: Secondary | ICD-10-CM

## 2015-04-25 ENCOUNTER — Ambulatory Visit (INDEPENDENT_AMBULATORY_CARE_PROVIDER_SITE_OTHER): Payer: Federal, State, Local not specified - PPO | Admitting: Family Medicine

## 2015-04-25 ENCOUNTER — Encounter: Payer: Self-pay | Admitting: Family Medicine

## 2015-04-25 VITALS — BP 102/72 | HR 73 | Temp 97.6°F | Ht 62.25 in | Wt 144.7 lb

## 2015-04-25 DIAGNOSIS — Z0000 Encounter for general adult medical examination without abnormal findings: Secondary | ICD-10-CM

## 2015-04-25 LAB — BASIC METABOLIC PANEL
BUN: 15 mg/dL (ref 6–23)
CO2: 31 mEq/L (ref 19–32)
CREATININE: 0.94 mg/dL (ref 0.40–1.20)
Calcium: 10 mg/dL (ref 8.4–10.5)
Chloride: 104 mEq/L (ref 96–112)
GFR: 65.26 mL/min (ref >60.00)
Glucose, Bld: 107 mg/dL — ABNORMAL HIGH (ref 70–99)
POTASSIUM: 5.2 meq/L — AB (ref 3.5–5.1)
Sodium: 143 mEq/L (ref 135–145)

## 2015-04-25 LAB — HEMOGLOBIN A1C: Hgb A1c MFr Bld: 5.6 % (ref 4.6–6.5)

## 2015-04-25 LAB — TSH: TSH: 2.09 u[IU]/mL (ref 0.35–4.50)

## 2015-04-25 LAB — CHOLESTEROL, TOTAL: Cholesterol: 171 mg/dL (ref 0–200)

## 2015-04-25 LAB — HDL CHOLESTEROL: HDL: 89.9 mg/dL (ref >39.00)

## 2015-04-25 NOTE — Progress Notes (Addendum)
HPI:  Here for CPE:  -Concerns and/or follow up today:   Weight: -she is pretty much vegetarian, eats 1400 cals per day and is getting 10,000 steps per day and does yoga on the weekends, no aerobic activity -she wants to recheck thyroid as is very frustrated with wt -BMI 26  Back pain: -chronic, mid back, neck, low back -plain films ok -reports: only feels mild pain if moves in very specific over arching position of back, otherwise if fine -denies:worsening, persistent pain, fevers, malaise, radiation, weakness, numbness  -Taking folic acid, vitamin D or calcium: no  -Diabetes and Dyslipidemia Screening:today, FASTING  -Hx of HTN: no  -Vaccines: UTD  -pap history: sees Dr. Billy Coast in gyn, reports utd on paps  -FDLMP:n/a  -wants STI testing (Hep C if born [birth year]): no  -FH breast, colon or ovarian ca: see FH Last mammogram: 06/2013 Last colon cancer screening: 04/2014   -Alcohol, Tobacco, drug use: see social history  Review of Systems - no fevers, unintentional weight loss, vision loss, hearing loss, chest pain, sob, hemoptysis, melena, hematochezia, hematuria, genital discharge, changing or concerning skin lesions, bleeding, bruising, loc, thoughts of self harm or SI  Past Medical History  Diagnosis Date  . BREAST MASS, LEFT 04/10/2007    Qualifier: Diagnosis of  By: Lovell Sheehan MD, Balinda Quails     Past Surgical History  Procedure Laterality Date  . Dilation and curettage of uterus    . Tonsil      Family History  Problem Relation Age of Onset  . Hypertension Mother   . Hypertension Father     Social History   Social History  . Marital Status: Married    Spouse Name: N/A  . Number of Children: N/A  . Years of Education: N/A   Social History Main Topics  . Smoking status: Former Games developer  . Smokeless tobacco: None     Comment: light smoker for 15 years, quit at age 37  . Alcohol Use: Yes     Comment: occ, 1-2 glass of wine a few times per week  . Drug Use:  None  . Sexual Activity: Not Asked   Other Topics Concern  . 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  calcium gluconate 500 MG tablet, Take 1 tablet by mouth daily., Disp: , Rfl:  .  cholecalciferol (VITAMIN D) 1000 UNITS tablet, Take 1,000 Units by mouth daily., Disp: , Rfl:  .  glucosamine-chondroitin 500-400 MG tablet, Take 1 tablet by mouth daily., Disp: , Rfl:  .  loratadine (CLARITIN) 10 MG tablet, Take 10 mg by mouth daily., Disp: , Rfl:  .  MAGNESIUM PO, Take by mouth., Disp: , Rfl:  .  Multiple Vitamin (MULTIVITAMIN) capsule, Take 1 capsule by mouth daily., Disp: , Rfl:  .  pseudoephedrine (SUDAFED) 30 MG tablet, Take 30 mg by mouth every 4 (four) hours as needed for congestion., Disp: , Rfl:   EXAM:  Filed Vitals:   04/25/15 1321  BP: 102/72  Pulse: 73  Temp: 97.6 F (36.4 C)    GENERAL: vitals reviewed and listed below, alert, oriented,  appears well hydrated and in no acute distress  HEENT: head atraumatic, PERRLA, normal appearance of eyes, ears, nose and mouth. moist mucus membranes.  NECK: supple, no masses or lymphadenopathy  LUNGS: clear to auscultation bilaterally, no rales, rhonchi or wheeze  CV: HRRR, no peripheral edema or cyanosis, normal pedal pulses  BREAST: declined  ABDOMEN: bowel sounds normal, soft, non tender to palpation, no masses, no rebound or guarding  GU: declined  SKIN: no rash or abnormal lesions  MS: normal gait, moves all extremities normally  NEURO: CN II-XII grossly intact, normal muscle strength and sensation to light touch on extremities  PSYCH: normal affect, pleasant and cooperative  ASSESSMENT AND PLAN:  Discussed the following assessment  and plan:  Visit for preventive health examination - Plan: Basic metabolic panel, TSH, Hemoglobin A1c, Cholesterol, Total, HDL cholesterol   -Discussed and advised all [place] preventive services health task force level A and B recommendations for age, sex and risks.  -Advised at least 150 minutes of exercise per week and a healthy diet low in saturated fats and sweets and consisting of fresh fruits and vegetables, lean meats such as fish and white chicken and whole grains.  -offered further imaging of back but given mild and barely there she opted to monitor and only eval further if worsening/  -discussed options to reach optimal weight - she is only a few lbs away. Does not seem is getting enough protein. Advised increased healthy protein to 45-50 g per day - 20-30% of calories and aerobic exercise 3 days per week.   -FASTING labs, studies and vaccines per orders this encounter  Orders Placed This Encounter  Procedures  . Basic metabolic panel  . TSH  . Hemoglobin A1c  . Cholesterol, Total  . HDL cholesterol    Patient advised to return to clinic immediately if symptoms worsen or persist or new concerns.  Patient Instructions  BEFORE YOU LEAVE: -labs -schedule Physical in 1 year or follow up sooner if you wish  See your gynecologist for your women's health exam  -We have ordered labs or studies at this visit. It can take up to 1-2 weeks for results and processing. We will contact you with instructions IF your results are abnormal. Normal results will be released to your [facility]. If you have not heard from [place] or can not find your results in [facility] in 2 weeks please contact our office.  We recommend the following healthy lifestyle measures: - eat a healthy whole foods diet consisting of regular small meals composed of vegetables, fruits, beans, nuts, seeds, healthy meats such as white chicken and fish and whole grains.  -ensure getting protein with every meal - at least 45-50 grams  per day - avoid sweets, white starchy foods, fried foods, fast food, processed foods, sodas, red meet and other fattening foods.  - get a least 150-300 minutes of aerobic exercise per week.           No Follow-up on file.  Kriste Basque R.

## 2015-04-25 NOTE — Patient Instructions (Addendum)
BEFORE YOU LEAVE: -labs -schedule Physical in 1 year or follow up sooner if you wish  See your gynecologist for your women's health exam  -We have ordered labs or studies at this visit. It can take up to 1-2 weeks for results and processing. We will contact you with instructions IF your results are abnormal. Normal results will be released to your [facility]. If you have not heard from [place] or can not find your results in [facility] in 2 weeks please contact our office.  We recommend the following healthy lifestyle measures: - eat a healthy whole foods diet consisting of regular small meals composed of vegetables, fruits, beans, nuts, seeds, healthy meats such as white chicken and fish and whole grains.  -ensure getting protein with every meal - at least 45-50 grams per day - avoid sweets, white starchy foods, fried foods, fast food, processed foods, sodas, red meet and other fattening foods.  - get a least 150-300 minutes of aerobic exercise per week.

## 2015-04-25 NOTE — Progress Notes (Signed)
Pre visit review using our clinic review tool, if applicable. No additional management support is needed unless otherwise documented below in the visit note. 

## 2015-07-29 DIAGNOSIS — Z1231 Encounter for screening mammogram for malignant neoplasm of breast: Secondary | ICD-10-CM | POA: Diagnosis not present

## 2016-01-08 ENCOUNTER — Ambulatory Visit (INDEPENDENT_AMBULATORY_CARE_PROVIDER_SITE_OTHER): Payer: Federal, State, Local not specified - PPO | Admitting: Family Medicine

## 2016-01-08 ENCOUNTER — Encounter: Payer: Self-pay | Admitting: Family Medicine

## 2016-01-08 VITALS — BP 118/68 | HR 72 | Temp 97.9°F | Ht 62.25 in | Wt 147.5 lb

## 2016-01-08 DIAGNOSIS — R1012 Left upper quadrant pain: Secondary | ICD-10-CM | POA: Diagnosis not present

## 2016-01-08 DIAGNOSIS — K581 Irritable bowel syndrome with constipation: Secondary | ICD-10-CM | POA: Diagnosis not present

## 2016-01-08 NOTE — Progress Notes (Signed)
HPI:  Acute visit for:  Constipation/abd pain: -chronic constipation and abd issues her whole life and dx with IBS with prior PCP -takes fiber supplement and probiotic sometimes -has occ very brief mild twinge of pain in the L upper quadrant for the last 6 weeks or so - can go several days without it then will notice -no persistent pain, change in bowels, weight loss, malaise, fevers, melena, hematochezia, UTD on HM measures - had colonoscopy 04/2014 with 10 year repeat advised  ROS: See pertinent positives and negatives per HPI.  Past Medical History:  Diagnosis Date  . BREAST MASS, LEFT 04/10/2007   Qualifier: Diagnosis of  By: Lovell Sheehan MD, Balinda Quails     Past Surgical History:  Procedure Laterality Date  . DILATION AND CURETTAGE OF UTERUS    . tonsil      Family History  Problem Relation Age of Onset  . Hypertension Mother   . Hypertension Father     Social History   Social History  . Marital status: Married    Spouse name: N/A  . Number of children: N/A  . Years of education: N/A   Social History Main Topics  . Smoking status: Former Games developer  . Smokeless tobacco: None     Comment: light smoker for 15 years, quit at age 18  . Alcohol use Yes     Comment: occ, 1-2 glass of wine a few times per week  . Drug use: Unknown  . Sexual activity: Not Asked   Other Topics Concern  . 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  calcium gluconate 500 MG tablet, Take 1 tablet by mouth daily., Disp: , Rfl:  .  cholecalciferol (VITAMIN D) 1000 UNITS tablet, Take 1,000 Units by mouth daily., Disp: , Rfl:  .  glucosamine-chondroitin 500-400 MG tablet, Take 1 tablet by mouth daily., Disp: , Rfl:  .   loratadine (CLARITIN) 10 MG tablet, Take 10 mg by mouth daily., Disp: , Rfl:  .  MAGNESIUM PO, Take by mouth., Disp: , Rfl:  .  Multiple Vitamin (MULTIVITAMIN) capsule, Take 1 capsule by mouth daily., Disp: , Rfl:  .  pseudoephedrine (SUDAFED) 30 MG tablet, Take 30 mg by mouth every 4 (four) hours as needed for congestion., Disp: , Rfl:   EXAM:  Vitals:   01/08/16 1601  BP: 118/68  Pulse: 72  Temp: 97.9 F (36.6 C)    Body mass index is 26.76 kg/m.  GENERAL: vitals reviewed and listed above, alert, oriented, appears well hydrated and in no acute distress  HEENT: atraumatic, conjunttiva clear, no obvious abnormalities on inspection of external nose and ears  NECK: no obvious masses on inspection  LUNGS: clear to auscultation bilaterally, no wheezes, rales or rhonchi, good air movement  CV: HRRR, no peripheral edema  ABD: BS+, soft, NTTP  MS: moves all extremities without noticeable abnormality  PSYCH: pleasant and cooperative, no obvious depression or anxiety  ASSESSMENT AND PLAN:  Discussed the following assessment and plan:  Irritable bowel syndrome with constipation  Left upper quadrant pain  -we discussed possible serious and likely etiologies,  workup and treatment, treatment risks and return precautions - suspect constipation/IBS -after this discussion, [name] opted for plan per pt instructions -follow up advised 1-2 months -of course, we advised [name]  to return or notify a doctor immediately if symptoms worsen or persist or new concerns arise.  Patient Instructions  BEFORE YOU LEAVE: -follow up: 4-6 weeks  Metameucil in water every morning before breakfast.  Align probiotic daily.  Mirilax daily for 3-7 days if any signs of constipation.   Try 2 weeks trial of no dairy then 2 week trial of no gluten.    Kriste BasqueKIM, HANNAH R., DO

## 2016-01-08 NOTE — Progress Notes (Signed)
Pre visit review using our clinic review tool, if applicable. No additional management support is needed unless otherwise documented below in the visit note. 

## 2016-01-08 NOTE — Patient Instructions (Signed)
BEFORE YOU LEAVE: -follow up: 4-6 weeks  Metameucil in water every morning before breakfast.  Align probiotic daily.  Mirilax daily for 3-7 days if any signs of constipation.   Try 2 weeks trial of no dairy then 2 week trial of no gluten.

## 2016-02-10 ENCOUNTER — Encounter: Payer: Self-pay | Admitting: Family Medicine

## 2016-02-10 ENCOUNTER — Ambulatory Visit (INDEPENDENT_AMBULATORY_CARE_PROVIDER_SITE_OTHER): Payer: Federal, State, Local not specified - PPO | Admitting: Family Medicine

## 2016-02-10 VITALS — BP 102/72 | HR 91 | Temp 98.0°F | Ht 62.25 in | Wt 149.3 lb

## 2016-02-10 DIAGNOSIS — K5900 Constipation, unspecified: Secondary | ICD-10-CM | POA: Diagnosis not present

## 2016-02-10 DIAGNOSIS — R1012 Left upper quadrant pain: Secondary | ICD-10-CM | POA: Diagnosis not present

## 2016-02-10 NOTE — Progress Notes (Addendum)
HPI:  Follow up IBS symptoms.LUQ abd pain: -did trial align and metameucil for constipation, no sig diet changes except less dairy -reports has been doing "GREAT!" with complete resolution of all symptoms with the exception of a few twinges of pain in L lateral flank and back following a very difficult yoga glass with extreme postures a few days ago.   ROS: See pertinent positives and negatives per HPI.  Past Medical History:  Diagnosis Date  . BREAST MASS, LEFT 04/10/2007   Qualifier: Diagnosis of  By: Lovell SheehanJenkins MD, Balinda QuailsJohn E     Past Surgical History:  Procedure Laterality Date  . DILATION AND CURETTAGE OF UTERUS    . tonsil      Family History  Problem Relation Age of Onset  . Hypertension Mother   . Hypertension Father     Social History   Social History  . Marital status: Married    Spouse name: N/A  . Number of children: N/A  . Years of education: N/A   Social History Main Topics  . Smoking status: Former Games developermoker  . Smokeless tobacco: None     Comment: light smoker for 15 years, quit at age 55  . Alcohol use Yes     Comment: occ, 1-2 glass of wine a few times per week  . Drug use: Unknown  . Sexual activity: Not Asked   Other Topics Concern  . 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  calcium gluconate 500 MG tablet, Take 1 tablet by mouth daily., Disp: , Rfl:  .  cholecalciferol (VITAMIN D) 1000 UNITS tablet, Take 1,000 Units by mouth daily., Disp: , Rfl:  .  glucosamine-chondroitin 500-400 MG tablet, Take 1 tablet by mouth daily., Disp: , Rfl:  .  loratadine (CLARITIN) 10 MG tablet, Take 10 mg by mouth daily., Disp: , Rfl:  .  MAGNESIUM PO, Take by mouth., Disp: , Rfl:  .   Multiple Vitamin (MULTIVITAMIN) capsule, Take 1 capsule by mouth daily., Disp: , Rfl:  .  pseudoephedrine (SUDAFED) 30 MG tablet, Take 30 mg by mouth every 4 (four) hours as needed for congestion., Disp: , Rfl:   EXAM:  Vitals:   02/10/16 1602  BP: 102/72  Pulse: 91  Temp: 98 F (36.7 C)    Body mass index is 27.09 kg/m.  GENERAL: vitals reviewed and listed above, alert, oriented, appears well hydrated and in no acute distress  HEENT: atraumatic, conjunttiva clear, no obvious abnormalities on inspection of external nose and ears  NECK: no obvious masses on inspection  LUNGS: clear to auscultation bilaterally, no wheezes, rales or rhonchi, good air movement  CV: HRRR, no peripheral edema  ABD: soft, NTTP  MS: moves all extremities without noticeable abnormality  PSYCH: pleasant and cooperative, no obvious depression or anxiety  ASSESSMENT AND PLAN:  Discussed the following assessment and plan:  Constipation, unspecified constipation type  Left upper quadrant pain  -bowel issues resolved -seem abd pain had resolved too and ? Muscle strain from yoga a few days ago -advised to call in 1 month for and update and if any  recurrent or persistent abd pain and would do CT, but otherwise seems to be doing well -Patient advised to return or notify a doctor immediately if symptoms worsen or persist or new concerns arise.  There are no Patient Instructions on file for this visit.  Joan Edwards, Joan Foucher R., DO

## 2016-02-10 NOTE — Progress Notes (Signed)
Pre visit review using our clinic review tool, if applicable. No additional management support is needed unless otherwise documented below in the visit note. 

## 2016-03-11 ENCOUNTER — Encounter: Payer: Self-pay | Admitting: Family Medicine

## 2016-03-18 ENCOUNTER — Ambulatory Visit (INDEPENDENT_AMBULATORY_CARE_PROVIDER_SITE_OTHER): Payer: Federal, State, Local not specified - PPO | Admitting: Family Medicine

## 2016-03-18 ENCOUNTER — Encounter: Payer: Self-pay | Admitting: Family Medicine

## 2016-03-18 VITALS — BP 102/62 | HR 64 | Temp 97.3°F | Ht 62.25 in | Wt 150.3 lb

## 2016-03-18 DIAGNOSIS — R1012 Left upper quadrant pain: Secondary | ICD-10-CM | POA: Diagnosis not present

## 2016-03-18 NOTE — Progress Notes (Signed)
Pre visit review using our clinic review tool, if applicable. No additional management support is needed unless otherwise documented below in the visit note. 

## 2016-03-18 NOTE — Patient Instructions (Signed)
BEFORE YOU LEAVE: -labs  -We placed a referral for you as discussed for the CT scan of the abdomen. It usually takes about 1-2 weeks to process and schedule this referral. If you have not heard from us regarding this appointment in 2 weeks please contact our office.

## 2016-03-18 NOTE — Progress Notes (Signed)
HPI:  Joan Edwards is a pleasant 56 year old here for a follow-up regarding left sided abdominal pain. She has had this intermittently for some time now. At her last visit it had seemed to resolve. However she reports it has returned and is present briefly almost daily. It is a discomfort mainly in the left upper and left mid abdomen. Her bowels have been normal since the initiation of small amount of additional fiber. No weight loss, fevers, malaise, melena or hematochezia. She had a friend die suddenly cancer, and is quite anxious about this and would like to do a CT scan of her abdomen.  ROS: See pertinent positives and negatives per HPI.  Past Medical History:  Diagnosis Date  . BREAST MASS, LEFT 04/10/2007   Qualifier: Diagnosis of  By: Lovell Sheehan MD, Balinda Quails     Past Surgical History:  Procedure Laterality Date  . DILATION AND CURETTAGE OF UTERUS    . tonsil      Family History  Problem Relation Age of Onset  . Hypertension Mother   . Hypertension Father     Social History   Social History  . Marital status: Married    Spouse name: N/A  . Number of children: N/A  . Years of education: N/A   Social History Main Topics  . Smoking status: Former Games developer  . Smokeless tobacco: None     Comment: light smoker for 15 years, quit at age 69  . Alcohol use Yes     Comment: occ, 1-2 glass of wine a few times per week  . Drug use: Unknown  . Sexual activity: Not Asked   Other Topics Concern  . 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  calcium gluconate 500 MG tablet, Take 1 tablet by mouth daily., Disp: , Rfl:  .  cholecalciferol (VITAMIN D) 1000 UNITS tablet, Take 1,000 Units by mouth daily.,  Disp: , Rfl:  .  glucosamine-chondroitin 500-400 MG tablet, Take 1 tablet by mouth daily., Disp: , Rfl:  .  loratadine (CLARITIN) 10 MG tablet, Take 10 mg by mouth daily., Disp: , Rfl:  .  MAGNESIUM PO, Take by mouth., Disp: , Rfl:  .  Multiple Vitamin (MULTIVITAMIN) capsule, Take 1 capsule by mouth daily., Disp: , Rfl:  .  pseudoephedrine (SUDAFED) 30 MG tablet, Take 30 mg by mouth every 4 (four) hours as needed for congestion., Disp: , Rfl:   EXAM:  Vitals:   03/18/16 1616  BP: 102/62  Pulse: 64  Temp: 97.3 F (36.3 C)    Body mass index is 27.27 kg/m.  GENERAL: vitals reviewed and listed above, alert, oriented, appears well hydrated and in no acute distress  HEENT: atraumatic, conjunttiva clear, no obvious abnormalities on inspection of external nose and ears  NECK: no obvious masses on inspection  LUNGS: clear to auscultation bilaterally, no wheezes, rales or rhonchi, good air movement  CV: HRRR, no peripheral edema  ABD: BS+, soft, NTTP  MS: moves all extremities without noticeable abnormality  PSYCH: pleasant and cooperative, no obvious depression or anxiety  ASSESSMENT AND PLAN:  Discussed the  following assessment and plan:  LUQ abdominal pain - Plan: Basic metabolic panel, CT Abdomen Pelvis W Contrast  -we discussed possible serious and likely etiologies, workup and treatment, treatment risks and return precautions -after this discussion, Joan Edwards opted for CT scan abd - BMP -follow up advised pending results -of course, we advised Joan Edwards  to return or notify a doctor immediately if symptoms worsen or persist or new concerns arise.  Patient Instructions  BEFORE YOU LEAVE: -labs  -We placed a referral for you as discussed for the CT scan of the abdomen. It usually takes about 1-2 weeks to process and schedule this referral. If you have not heard from us regarding this appointment in 2 weeks please contact our office.     Kriste BasqueKIM, Joan Statler R., DO

## 2016-03-19 LAB — BASIC METABOLIC PANEL
BUN: 15 mg/dL (ref 6–23)
CO2: 29 mEq/L (ref 19–32)
CREATININE: 0.83 mg/dL (ref 0.40–1.20)
Calcium: 9.4 mg/dL (ref 8.4–10.5)
Chloride: 102 mEq/L (ref 96–112)
GFR: 75.1 mL/min (ref >60.00)
Glucose, Bld: 91 mg/dL (ref 70–99)
Potassium: 3.9 mEq/L (ref 3.5–5.1)
Sodium: 140 mEq/L (ref 135–145)

## 2016-03-22 ENCOUNTER — Encounter: Payer: Self-pay | Admitting: Family Medicine

## 2016-03-23 ENCOUNTER — Encounter: Payer: Self-pay | Admitting: Family Medicine

## 2016-03-24 ENCOUNTER — Ambulatory Visit
Admission: RE | Admit: 2016-03-24 | Discharge: 2016-03-24 | Disposition: A | Discharge status: Home / Self Care | Payer: Federal, State, Local not specified - PPO | Source: Ambulatory Visit | Attending: Family Medicine | Admitting: Family Medicine

## 2016-03-24 DIAGNOSIS — R1012 Left upper quadrant pain: Secondary | ICD-10-CM | POA: Diagnosis not present

## 2016-03-24 MED ORDER — IOPAMIDOL (ISOVUE-300) INJECTION 61%: 100.0000 mL | Freq: Once | INTRAVENOUS | Status: DC | PRN

## 2016-03-31 ENCOUNTER — Encounter: Payer: Self-pay | Admitting: Family Medicine

## 2016-05-05 DIAGNOSIS — L929 Granulomatous disorder of the skin and subcutaneous tissue, unspecified: Secondary | ICD-10-CM | POA: Diagnosis not present

## 2016-08-31 DIAGNOSIS — Z1231 Encounter for screening mammogram for malignant neoplasm of breast: Secondary | ICD-10-CM | POA: Diagnosis not present

## 2016-09-02 ENCOUNTER — Other Ambulatory Visit: Payer: Self-pay | Admitting: Obstetrics and Gynecology

## 2016-09-02 DIAGNOSIS — N63 Unspecified lump in unspecified breast: Secondary | ICD-10-CM

## 2016-09-06 ENCOUNTER — Ambulatory Visit
Admission: RE | Admit: 2016-09-06 | Discharge: 2016-09-06 | Disposition: A | Discharge status: Home / Self Care | Payer: Federal, State, Local not specified - PPO | Source: Ambulatory Visit | Attending: Obstetrics and Gynecology | Admitting: Obstetrics and Gynecology

## 2016-09-06 DIAGNOSIS — N63 Unspecified lump in unspecified breast: Secondary | ICD-10-CM

## 2016-09-06 DIAGNOSIS — R928 Other abnormal and inconclusive findings on diagnostic imaging of breast: Secondary | ICD-10-CM | POA: Diagnosis not present

## 2016-09-06 DIAGNOSIS — N6011 Diffuse cystic mastopathy of right breast: Secondary | ICD-10-CM | POA: Diagnosis not present

## 2016-10-27 DIAGNOSIS — Z6825 Body mass index (BMI) 25.0-25.9, adult: Secondary | ICD-10-CM | POA: Diagnosis not present

## 2016-10-27 DIAGNOSIS — Z01419 Encounter for gynecological examination (general) (routine) without abnormal findings: Secondary | ICD-10-CM | POA: Diagnosis not present

## 2016-12-02 ENCOUNTER — Encounter: Payer: Self-pay | Admitting: Family Medicine

## 2017-03-24 ENCOUNTER — Encounter: Payer: Self-pay | Admitting: Family Medicine

## 2017-05-19 ENCOUNTER — Ambulatory Visit: Payer: Self-pay | Admitting: Hematology

## 2017-05-19 NOTE — Telephone Encounter (Signed)
Patient's son was diagnosed with the flu today.  Patient is currently not having any symptoms.  They plan to take a trip next week and patient is asking for Tamiflu since she has been exposed. / Pharmacy on file is correct

## 2017-05-20 MED ORDER — OSELTAMIVIR PHOSPHATE 75 MG PO CAPS: 75.0000 mg | ORAL_CAPSULE | Freq: Every day | ORAL | 0 refills | Status: DC

## 2017-05-20 NOTE — Addendum Note (Signed)
Addended by: Johnella MoloneyFUNDERBURK, Brookelle Pellicane A on: 05/20/2017 10:37 AM   Modules accepted: Orders

## 2017-05-20 NOTE — Telephone Encounter (Signed)
I called the pt and informed her of the message below.  Patient stated she wants to take the prescription and she is aware this was sent to [facility].

## 2017-05-20 NOTE — Addendum Note (Signed)
Addended by: Raj JanusADKINS, MISTY T on: 05/20/2017 03:04 PM   Modules accepted: Orders

## 2017-05-20 NOTE — Telephone Encounter (Signed)
Prophylaxis is not usually recommended unless patient is high risk for complications. This is due to potential risks with tamiflu including potential serious psychiatric risks, nausea, vomiting and possible resistance among others. However, if she wishes to take these risks and is within 24 hours of exposure can send 75mg  once daily x 10 days.

## 2017-05-20 NOTE — Telephone Encounter (Signed)
[facility] called back stating medication was not received by pharmacy.  Confirmed this by Epic.  Rx sent electronically.

## 2017-05-20 NOTE — Telephone Encounter (Signed)
Contacted pt's husband to make him aware that the request is waiting for approval for the provider; he states that his wife has already spoken with the office.   Reason for Disposition . [1] Influenza EXPOSURE within last 72 hours (3 days) AND [2] NOT HIGH RISK AND [3] strongly requests antiviral medication  Answer Assessment - Initial Assessment Questions 1. TYPE of EXPOSURE: "How were you exposed?" (e.g., close contact, not a close contact)     Son has the flu 2. DATE of EXPOSURE: "When did the exposure occur?" (e.g., hour, days, weeks)     Diagnosed yesterday 3. PREGNANCY: "Is there any chance you are pregnant?" "When was your last menstrual period?"     *No Answer* 4. HIGH RISK for COMPLICATIONS: "Do you have any heart or lung problems? Do you have a weakened immune system?" (e.g., CHF, COPD, asthma, HIV positive, chemotherapy, renal failure, diabetes mellitus, sickle cell anemia)     *No Answer* 5. SYMPTOMS: "Do you have any symptoms?" (e.g., cough, fever, sore throat, difficulty breathing).     *No Answer*  Protocols used: INFLUENZA EXPOSURE-A-AH

## 2017-05-20 NOTE — Telephone Encounter (Signed)
Spoke with Misty at [facility]; she states that the pt's request is still in Dr [employer]; she also says that Dr Selena Batten is with patients

## 2017-08-26 ENCOUNTER — Other Ambulatory Visit: Payer: Self-pay | Admitting: Family Medicine

## 2017-08-26 DIAGNOSIS — Z1231 Encounter for screening mammogram for malignant neoplasm of breast: Secondary | ICD-10-CM

## 2017-09-20 ENCOUNTER — Ambulatory Visit
Admission: RE | Admit: 2017-09-20 | Discharge: 2017-09-20 | Disposition: A | Discharge status: Home / Self Care | Payer: Federal, State, Local not specified - PPO | Source: Ambulatory Visit | Attending: Family Medicine | Admitting: Family Medicine

## 2017-09-20 DIAGNOSIS — Z1231 Encounter for screening mammogram for malignant neoplasm of breast: Secondary | ICD-10-CM | POA: Diagnosis not present

## 2017-09-30 IMAGING — DX DG THORACIC SPINE 3V
2 series · 2 of 2 positions shown · non-contrast
Comparison: None.

CLINICAL DATA: Back pain for 2 months.  No known injury.

EXAM:
THORACIC SPINE - 3 VIEWS

[t-spine ap]
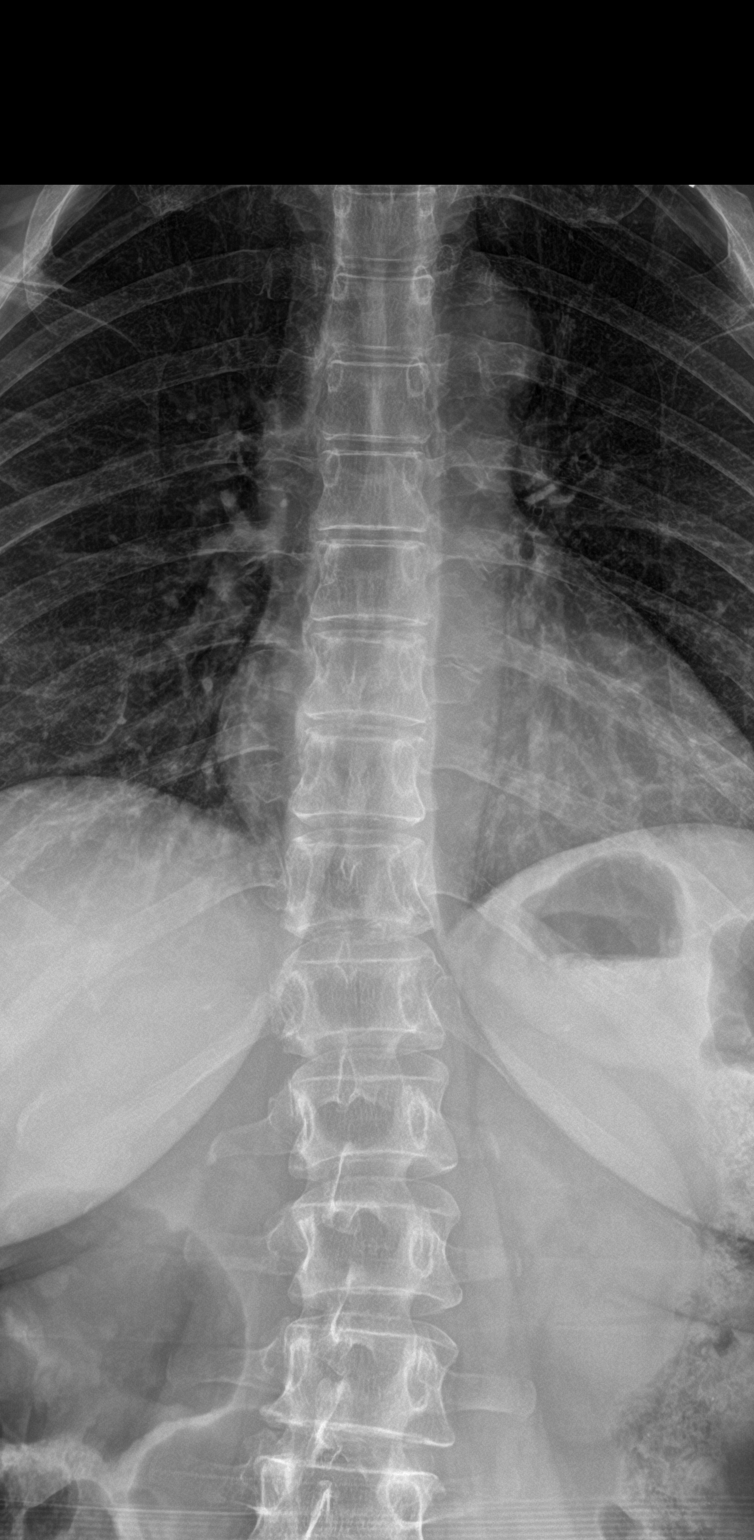

[t-spine lat]
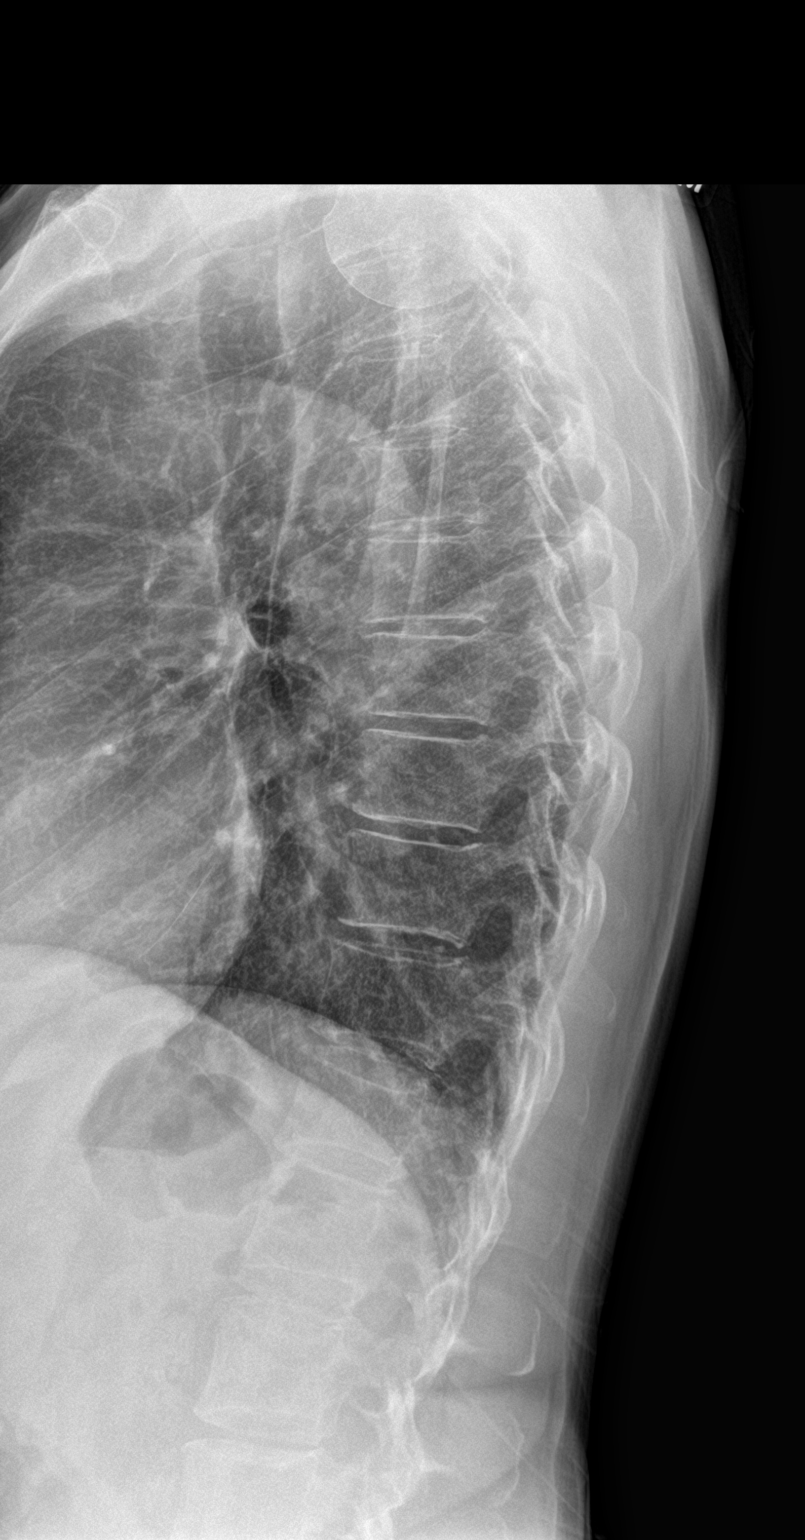

[2 of 2 positions shown; findings below may reference images not displayed]

FINDINGS: There is no evidence of thoracic spine fracture. Alignment is
normal. No other significant bone abnormalities are identified.
IMPRESSION: Negative.

## 2017-10-18 ENCOUNTER — Other Ambulatory Visit (HOSPITAL_COMMUNITY)
Admission: RE | Admit: 2017-10-18 | Discharge: 2017-10-18 | Disposition: A | Discharge status: Home / Self Care | Payer: Federal, State, Local not specified - PPO | Source: Ambulatory Visit | Attending: Family Medicine | Admitting: Family Medicine

## 2017-10-18 ENCOUNTER — Encounter: Payer: Self-pay | Admitting: Family Medicine

## 2017-10-18 ENCOUNTER — Ambulatory Visit (INDEPENDENT_AMBULATORY_CARE_PROVIDER_SITE_OTHER): Payer: Federal, State, Local not specified - PPO | Admitting: Family Medicine

## 2017-10-18 VITALS — BP 100/80 | HR 72 | Temp 98.0°F | Ht 62.25 in | Wt 142.1 lb

## 2017-10-18 DIAGNOSIS — Z7189 Other specified counseling: Secondary | ICD-10-CM

## 2017-10-18 DIAGNOSIS — Z0000 Encounter for general adult medical examination without abnormal findings: Secondary | ICD-10-CM

## 2017-10-18 DIAGNOSIS — Z23 Encounter for immunization: Secondary | ICD-10-CM | POA: Diagnosis not present

## 2017-10-18 DIAGNOSIS — Z1159 Encounter for screening for other viral diseases: Secondary | ICD-10-CM | POA: Diagnosis not present

## 2017-10-18 DIAGNOSIS — Z7185 Encounter for immunization safety counseling: Secondary | ICD-10-CM

## 2017-10-18 DIAGNOSIS — Z124 Encounter for screening for malignant neoplasm of cervix: Secondary | ICD-10-CM | POA: Insufficient documentation

## 2017-10-18 DIAGNOSIS — Z9189 Other specified personal risk factors, not elsewhere classified: Secondary | ICD-10-CM

## 2017-10-18 LAB — HDL CHOLESTEROL: HDL: 95.3 mg/dL (ref >39.00)

## 2017-10-18 LAB — CHOLESTEROL, TOTAL: Cholesterol: 178 mg/dL (ref 0–200)

## 2017-10-18 LAB — HEMOGLOBIN A1C: Hgb A1c MFr Bld: 5.5 % (ref 4.6–6.5)

## 2017-10-18 NOTE — Progress Notes (Signed)
HPI:  Using dictation device. Unfortunately this device frequently misinterprets words/phrases.  Here for CPE: De for labs, pap, tetanus booster, hep c -Concerns and/or follow up today: none She has a history of chronic back pain and plans to schedule a separate visit for his.   -Diet: variety of foods, balance and well rounded -Exercise: regular exercise -Taking folic acid, vitamin D or calcium: no -Diabetes and Dyslipidemia Screening:  -Vaccines: see vaccine section EPIC -pap history: 2012 done with Dr. Ronita Hipps per epic - normal -FDLMP: see nursing notes -sexual activity: yes, female partner, no new partners -wants STI testing (Hep C if born [birth year]): no -FH breast, colon or ovarian ca: see FH Last mammogram: 7/19 birads 1 Last colon cancer screening: colonoscopy 04/2014, Dr. Collene Mares, per epic notes normal and repeat in 10 years advised Breast Ca Risk Assessment: see family history and pt history DEXA (>/= 24): n/a  -Alcohol, Tobacco, drug use: see social history  Review of Systems - no fevers, unintentional weight loss, vision loss, hearing loss, chest pain, sob, hemoptysis, melena, hematochezia, hematuria, genital discharge, changing or concerning skin lesions, bleeding, bruising, loc, thoughts of self harm or SI  Past Medical History:  Diagnosis Date  . BREAST MASS, LEFT 04/10/2007   Qualifier: Diagnosis of  By: Arnoldo Morale MD, Balinda Quails     Past Surgical History:  Procedure Laterality Date  . BREAST CYST EXCISION    . DILATION AND CURETTAGE OF UTERUS    . tonsil      Family History  Problem Relation Age of Onset  . Hypertension Mother   . Hypertension Father     Social History   Socioeconomic History  . Marital status: Married    Spouse name: Not on file  . Number of children: Not on file  . Years of education: Not on file  . Highest education level: Not on file  Occupational History  . Not on file  Social Needs  . Financial resource strain: Not on file  . Food  insecurity:    Worry: Not on file    Inability: Not on file  . Transportation needs:    Medical: Not on file    Non-medical: Not on file  Tobacco Use  . Smoking status: Former Research scientist (life sciences)  . Smokeless tobacco: Never Used  . Tobacco comment: light smoker for 15 years, quit at age 35  Substance and Sexual Activity  . Alcohol use: Yes    Comment: occ, 1-2 glass of wine a few times per week  . Drug use: Not on file  . Sexual activity: Not on file  Lifestyle  . Physical activity:    Days per week: Not on file    Minutes per session: Not on file  . Stress: Not on file  Relationships  . Social connections:    Talks on phone: Not on file    Gets together: Not on file    Attends religious service: Not on file    Active member of club or organization: Not on file    Attends meetings of clubs or organizations: Not on file    Relationship status: Not on file  Other Topics Concern  . 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  calcium gluconate 500 MG tablet, Take 1 tablet by mouth daily., Disp: , Rfl:  .  cholecalciferol (VITAMIN D) 1000 UNITS tablet, Take 1,000 Units by mouth daily., Disp: , Rfl:  .  glucosamine-chondroitin 500-400 MG tablet, Take 1 tablet by mouth daily., Disp: , Rfl:  .  loratadine (CLARITIN) 10 MG tablet, Take 10 mg by mouth daily., Disp: , Rfl:  .  MAGNESIUM PO, Take by mouth., Disp: , Rfl:  .  Multiple Vitamin (MULTIVITAMIN) capsule, Take 1 capsule by mouth daily., Disp: , Rfl:  .  Omega-3 Fatty Acids (FISH OIL PO), Take by mouth daily., Disp: , Rfl:  .  pseudoephedrine (SUDAFED) 30 MG tablet, Take 30 mg by mouth every 4 (four) hours as needed for congestion., Disp: , Rfl:  .  TURMERIC PO,  Take by mouth daily., Disp: , Rfl:   EXAM:  Vitals:   10/18/17 1437  BP: 100/80  Pulse: 72  Temp: 98 F (36.7 C)    GENERAL: vitals reviewed and listed below, alert, oriented, appears well hydrated and in no acute distress  HEENT: head atraumatic, PERRLA, normal appearance of eyes, ears, nose and mouth. moist mucus membranes.  NECK: supple, no masses or lymphadenopathy  LUNGS: clear to auscultation bilaterally, no rales, rhonchi or wheeze  CV: HRRR, no peripheral edema or cyanosis, normal pedal pulses  ABDOMEN: bowel sounds normal, soft, non tender to palpation, no masses, no rebound or guarding BREAST: normal appearance - no skin lesions or discharge noted on inspection of both breasts, on palpation of both breast and axillary region no suspicious lesions appreciated today  GU: normal appearance of external genitalia - no lesions or masses appreciated, normal appearing vaginal mucosa - no abnormal discharge, normal appearance of cervix - no lesions or abnormal discharge observed  RECTAL: deferred  SKIN: no rash or abnormal lesions  MS: normal gait, moves all extremities normally  NEURO: normal gait, speech and thought processing grossly intact, muscle tone grossly intact throughout  PSYCH: normal affect, pleasant and cooperative  ASSESSMENT AND PLAN:  Discussed the following assessment and plan:  PREVENTIVE EXAM: -Discussed and advised all [place] preventive services health task force level A and B recommendations for age, sex and risks. -Advised at least 150 minutes of exercise per week and a healthy diet  -labs, studies and vaccines per orders this encounter -pap obtained -tetanus booster -she plans to schedule a visit to address her chronic back pain at another time  Patient advised to return to clinic immediately if symptoms worsen or persist or new concerns.  Patient Instructions  BEFORE YOU LEAVE: -labs -tetanus booster -follow up: yearly for physical and as  needed   Preventive Care 40-64 Years, Female Preventive care refers to lifestyle choices and visits with your health care provider that can promote health and wellness. What does preventive care include?  A yearly physical exam. This is also called an annual well check.  Dental exams once or twice a year.  Routine eye exams. Ask your health care provider how often you should have your eyes checked.  Personal lifestyle choices, including: ? Daily care of your teeth and gums. ? Regular physical activity. ? Eating a healthy diet. ? Avoiding tobacco and drug use. ? Limiting alcohol use. ? Practicing safe sex. ? Taking vitamin and mineral supplements as recommended by your health care provider. What happens during an annual well check?  The services and screenings done by your health care provider during your annual well check will depend on your age, overall health, lifestyle risk factors, and family history of disease. Counseling Your health care provider may ask you questions about your:  Alcohol use.  Tobacco use.  Drug use.  Emotional well-being.  Home and relationship well-being.  Sexual activity.  Eating habits.  Work and work Statistician.  Method of birth control.  Menstrual cycle.  Pregnancy history.  Screening You may have the following tests or measurements:  Height, weight, and BMI.  Blood pressure.  Lipid and cholesterol levels. These may be checked every 5 years, or more frequently if you are over 65 years old.  Skin check.  Lung cancer screening. You may have this screening every year starting at age 68 if you have a 30-pack-year history of smoking and currently smoke or have quit within the past 15 years.  Fecal occult blood test (FOBT) of the stool. You may have this test every year starting at age 19.  Flexible sigmoidoscopy or colonoscopy. You may have a sigmoidoscopy every 5 years or a colonoscopy every 10 years starting at age  74.  Hepatitis C blood test.  Hepatitis B blood test.  Sexually transmitted disease (STD) testing.  Diabetes screening. This is done by checking your blood sugar (glucose) after you have not eaten for a while (fasting). You may have this done every 1-3 years.  Mammogram. This may be done every 1-2 years. Talk to your health care provider about when you should start having regular mammograms. This may depend on whether you have a family history of breast cancer.  BRCA-related cancer screening. This may be done if you have a family history of breast, ovarian, tubal, or peritoneal cancers.  Pelvic exam and Pap test. This may be done every 3 years starting at age 79. Starting at age 3, this may be done every 5 years if you have a Pap test in combination with an HPV test.  Bone density scan. This is done to screen for osteoporosis. You may have this scan if you are at high risk for osteoporosis.  Discuss your test results, treatment options, and if necessary, the need for more tests with your health care provider. Vaccines Your health care provider may recommend certain vaccines, such as:  Influenza vaccine. This is recommended every year.  Tetanus, diphtheria, and acellular pertussis (Tdap, Td) vaccine. You may need a Td booster every 10 years.  Varicella vaccine. You may need this if you have not been vaccinated.  Zoster vaccine. You may need this after age 34.  Measles, mumps, and rubella (MMR) vaccine. You may need at least one dose of MMR if you were born in 1957 or later. You may also need a second dose.  Pneumococcal 13-valent conjugate (PCV13) vaccine. You may need this if you have certain conditions and were not previously vaccinated.  Pneumococcal polysaccharide (PPSV23) vaccine. You may need one or two doses if you smoke cigarettes or if you have certain conditions.  Meningococcal vaccine. You may need this if you have certain conditions.  Hepatitis A vaccine. You may  need this if you have certain conditions or if you travel or work in places where you may be exposed to hepatitis A.  Hepatitis B vaccine. You may need this if you have certain conditions or if you travel or work in places where you may be exposed to hepatitis B.  Haemophilus influenzae type b (Hib) vaccine. You may  need this if you have certain conditions.  Talk to your health care provider about which screenings and vaccines you need and how often you need them. This information is not intended to replace advice given to you by your health care provider. Make sure you discuss any questions you have with your health care provider. Document Released: 03/28/2015 Document Revised: 11/19/2015 Document Reviewed: 12/31/2014 Elsevier Interactive Patient Education  2018 [employer].     No follow-ups on file.  Lucretia Kern, DO

## 2017-10-18 NOTE — Patient Instructions (Addendum)
BEFORE YOU LEAVE: -phq9 in epic -labs -tetanus booster -follow up: yearly for physical and as needed   Preventive Care 40-64 Years, Female Preventive care refers to lifestyle choices and visits with your health care provider that can promote health and wellness. What does preventive care include?  A yearly physical exam. This is also called an annual well check.  Dental exams once or twice a year.  Routine eye exams. Ask your health care provider how often you should have your eyes checked.  Personal lifestyle choices, including: ? Daily care of your teeth and gums. ? Regular physical activity. ? Eating a healthy diet. ? Avoiding tobacco and drug use. ? Limiting alcohol use. ? Practicing safe sex. ? Taking vitamin and mineral supplements as recommended by your health care provider. What happens during an annual well check? The services and screenings done by your health care provider during your annual well check will depend on your age, overall health, lifestyle risk factors, and family history of disease. Counseling Your health care provider may ask you questions about your:  Alcohol use.  Tobacco use.  Drug use.  Emotional well-being.  Home and relationship well-being.  Sexual activity.  Eating habits.  Work and work Statistician.  Method of birth control.  Menstrual cycle.  Pregnancy history.  Screening You may have the following tests or measurements:  Height, weight, and BMI.  Blood pressure.  Lipid and cholesterol levels. These may be checked every 5 years, or more frequently if you are over 70 years old.  Skin check.  Lung cancer screening. You may have this screening every year starting at age 24 if you have a 30-pack-year history of smoking and currently smoke or have quit within the past 15 years.  Fecal occult blood test (FOBT) of the stool. You may have this test every year starting at age 35.  Flexible sigmoidoscopy or colonoscopy. You  may have a sigmoidoscopy every 5 years or a colonoscopy every 10 years starting at age 77.  Hepatitis C blood test.  Hepatitis B blood test.  Sexually transmitted disease (STD) testing.  Diabetes screening. This is done by checking your blood sugar (glucose) after you have not eaten for a while (fasting). You may have this done every 1-3 years.  Mammogram. This may be done every 1-2 years. Talk to your health care provider about when you should start having regular mammograms. This may depend on whether you have a family history of breast cancer.  BRCA-related cancer screening. This may be done if you have a family history of breast, ovarian, tubal, or peritoneal cancers.  Pelvic exam and Pap test. This may be done every 3 years starting at age 71. Starting at age 44, this may be done every 5 years if you have a Pap test in combination with an HPV test.  Bone density scan. This is done to screen for osteoporosis. You may have this scan if you are at high risk for osteoporosis.  Discuss your test results, treatment options, and if necessary, the need for more tests with your health care provider. Vaccines Your health care provider may recommend certain vaccines, such as:  Influenza vaccine. This is recommended every year.  Tetanus, diphtheria, and acellular pertussis (Tdap, Td) vaccine. You may need a Td booster every 10 years.  Varicella vaccine. You may need this if you have not been vaccinated.  Zoster vaccine. You may need this after age 66.  Measles, mumps, and rubella (MMR) vaccine. You may need at  least one dose of MMR if you were born in 1957 or later. You may also need a second dose.  Pneumococcal 13-valent conjugate (PCV13) vaccine. You may need this if you have certain conditions and were not previously vaccinated.  Pneumococcal polysaccharide (PPSV23) vaccine. You may need one or two doses if you smoke cigarettes or if you have certain conditions.  Meningococcal  vaccine. You may need this if you have certain conditions.  Hepatitis A vaccine. You may need this if you have certain conditions or if you travel or work in places where you may be exposed to hepatitis A.  Hepatitis B vaccine. You may need this if you have certain conditions or if you travel or work in places where you may be exposed to hepatitis B.  Haemophilus influenzae type b (Hib) vaccine. You may need this if you have certain conditions.  Talk to your health care provider about which screenings and vaccines you need and how often you need them. This information is not intended to replace advice given to you by your health care provider. Make sure you discuss any questions you have with your health care provider. Document Released: 03/28/2015 Document Revised: 11/19/2015 Document Reviewed: 12/31/2014 Elsevier Interactive Patient Education  [employer].

## 2017-10-18 NOTE — Addendum Note (Signed)
Addended by: Johnella MoloneyFUNDERBURK, Ivyonna Hoelzel A on: 10/18/2017 03:43 PM   Modules accepted: Orders

## 2017-10-18 NOTE — Addendum Note (Signed)
Addended by: Johnella MoloneyFUNDERBURK, Kinaya Hilliker A on: 10/18/2017 03:16 PM   Modules accepted: Orders

## 2017-10-19 LAB — CYTOLOGY - PAP
DIAGNOSIS: NEGATIVE
HPV: NOT DETECTED

## 2017-10-19 LAB — HEPATITIS C ANTIBODY
Hepatitis C Ab: NONREACTIVE
SIGNAL TO CUT-OFF: 0.02 (ref <1.00)

## 2017-11-28 ENCOUNTER — Other Ambulatory Visit: Payer: Self-pay

## 2017-11-28 ENCOUNTER — Ambulatory Visit: Payer: Federal, State, Local not specified - PPO | Admitting: Family Medicine

## 2017-11-28 ENCOUNTER — Encounter: Payer: Self-pay | Admitting: Family Medicine

## 2017-11-28 VITALS — BP 126/80 | HR 75 | Temp 98.3°F | Resp 15 | Ht 62.25 in | Wt 146.1 lb

## 2017-11-28 DIAGNOSIS — B9789 Other viral agents as the cause of diseases classified elsewhere: Secondary | ICD-10-CM

## 2017-11-28 DIAGNOSIS — J069 Acute upper respiratory infection, unspecified: Secondary | ICD-10-CM

## 2017-11-28 NOTE — Progress Notes (Signed)
  Subjective:     Patient ID: Mikel CellaAnne A Belfiore, female   DOB: 1960/07/05, 57 y.o.   MRN: [id number]  HPI Patient seen with onset this past Tuesday of sore throat, cough, postnasal drip, nausea without vomiting. She's had some general malaise. She had some initial body aches but these are improving. Sore throat gradually improving. She has not had any fever. 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 No night sweats. No chills. No dysuria.  Past Medical History:  Diagnosis Date  . BREAST MASS, LEFT 04/10/2007   Qualifier: Diagnosis of  By: Lovell SheehanJenkins MD, Balinda QuailsJohn E    Past Surgical History:  Procedure Laterality Date  . BREAST CYST EXCISION    . DILATION AND CURETTAGE OF UTERUS    . tonsil      reports that she has quit smoking. She has never used smokeless tobacco. She reports that she drinks alcohol. She reports that she does not use drugs. family history includes Hypertension in her father and mother. Allergies  Allergen Reactions  . Erythromycin     REACTION: Upset GI  . Other     Bananas-hives  . Polymyxin B-Trimethoprim Itching  . Sulfonamide Derivatives     REACTION: itching     Review of Systems  Constitutional: Positive for fatigue. Negative for chills and fever.  HENT: Positive for congestion, postnasal drip and sore throat.   Respiratory: Positive for cough. Negative for shortness of breath and wheezing.   Cardiovascular: Negative for chest pain.  Gastrointestinal: Positive for nausea. Negative for diarrhea and vomiting.       Objective:   Physical Exam  Constitutional: She appears well-developed and well-nourished.  HENT:  Right Ear: External ear normal.  Left Ear: External ear normal.  Mouth/Throat: Oropharynx is clear and moist.  Neck: Neck supple.  Cardiovascular: Normal rate and regular rhythm.  Pulmonary/Chest: Effort normal and breath sounds normal. No respiratory distress. She has no wheezes. She  has no rales.  Lymphadenopathy:    She has no cervical adenopathy.       Assessment:     Probable viral URI with cough. Non-focal exam.    Plan:     -recommend plenty of fluids and rest -Continue over-the-counter medications as needed -Follow-up promptly for any fever or any persistent or worsening symptoms.  Kristian CoveyBruce W Chinyere Galiano MD [facility]

## 2017-11-28 NOTE — Patient Instructions (Signed)

## 2017-12-18 DIAGNOSIS — J209 Acute bronchitis, unspecified: Secondary | ICD-10-CM | POA: Diagnosis not present

## 2017-12-27 ENCOUNTER — Encounter: Payer: Self-pay | Admitting: Family Medicine

## 2017-12-27 NOTE — Telephone Encounter (Signed)
Not on med list. Can you call pt and get exact names, strength, dosing info and why used, and then send refill x1 to pharmacy.thanks.

## 2017-12-30 MED ORDER — FAMCICLOVIR 250 MG PO TABS: ORAL_TABLET | ORAL | 1 refills | Status: DC

## 2017-12-30 MED ORDER — TRIAMCINOLONE ACETONIDE 0.025 % EX OINT: 1.0000 "application " | TOPICAL_OINTMENT | Freq: Two times a day (BID) | CUTANEOUS | 0 refills | Status: DC

## 2018-01-05 MED ORDER — NYSTATIN-TRIAMCINOLONE 100000-0.1 UNIT/GM-% EX OINT: 1.0000 "application " | TOPICAL_OINTMENT | Freq: Two times a day (BID) | CUTANEOUS | 0 refills | Status: AC

## 2018-01-05 NOTE — Addendum Note (Signed)
Addended by: Johnella Moloney on: 01/05/2018 07:28 AM   Modules accepted: Orders

## 2018-04-11 ENCOUNTER — Ambulatory Visit (INDEPENDENT_AMBULATORY_CARE_PROVIDER_SITE_OTHER): Payer: Federal, State, Local not specified - PPO | Admitting: Family Medicine

## 2018-04-11 VITALS — BP 102/64 | Temp 98.2°F | Wt 149.7 lb

## 2018-04-11 DIAGNOSIS — M5442 Lumbago with sciatica, left side: Secondary | ICD-10-CM

## 2018-04-11 DIAGNOSIS — M9903 Segmental and somatic dysfunction of lumbar region: Secondary | ICD-10-CM

## 2018-04-11 DIAGNOSIS — M9905 Segmental and somatic dysfunction of pelvic region: Secondary | ICD-10-CM

## 2018-04-11 NOTE — Progress Notes (Signed)
HPI:  Using dictation device. Unfortunately this device frequently misinterprets words/phrases.  Acute visit for chronic L low back pain: -for about 10 years -intermittent pain in the L low back with several flares of pain per year that last 3-10 days, sometimes with radiation to the L buttock and/or leg -resolves with massage, stretching conservative OTC measures -does not like to take medications -she is currently not having pain -denies: weakness, numbness, fevers, malaise, bowel or bladder incontinence -cant think of trauma -has tension/tightness in the low back constantly between flares -wonders if PT may help prevent flares -prefers conservative meausures -had thoracic plain films 2017 ok, had CT abd/pelvis with note of mild degenerative changes bilat SI joints in 2018  ROS: See pertinent positives and negatives per HPI.  Past Medical History:  Diagnosis Date  . BREAST MASS, LEFT 04/10/2007   Qualifier: Diagnosis of  By: Lovell Sheehan MD, Balinda Quails     Past Surgical History:  Procedure Laterality Date  . BREAST CYST EXCISION    . DILATION AND CURETTAGE OF UTERUS    . tonsil      Family History  Problem Relation Age of Onset  . Hypertension Mother   . Hypertension Father     SOCIAL HX: see hpi   Current Outpatient Medications:  .  calcium gluconate 500 MG tablet, Take 1 tablet by mouth daily., Disp: , Rfl:  .  cholecalciferol (VITAMIN D) 1000 UNITS tablet, Take 1,000 Units by mouth daily., Disp: , Rfl:  .  famciclovir (FAMVIR) 250 MG tablet, Take one tablet twice daily for 5 days as needed, Disp: 60 tablet, Rfl: 1 .  glucosamine-chondroitin 500-400 MG tablet, Take 1 tablet by mouth daily., Disp: , Rfl:  .  loratadine (CLARITIN) 10 MG tablet, Take 10 mg by mouth daily., Disp: , Rfl:  .  MAGNESIUM PO, Take by mouth., Disp: , Rfl:  .  Multiple Vitamin (MULTIVITAMIN) capsule, Take 1 capsule by mouth daily., Disp: , Rfl:  .  Omega-3 Fatty Acids (FISH OIL PO), Take by mouth  daily., Disp: , Rfl:  .  pseudoephedrine (SUDAFED) 30 MG tablet, Take 30 mg by mouth every 4 (four) hours as needed for congestion., Disp: , Rfl:  .  triamcinolone (KENALOG) 0.025 % ointment, Apply 1 application topically 2 (two) times daily., Disp: 30 g, Rfl: 0 .  TURMERIC PO, Take by mouth daily., Disp: , Rfl:   EXAM:  Vitals:   04/11/18 2130  BP: 102/64  Temp: 98.2 F (36.8 C)    Body mass index is 27.16 kg/m. Epic down at times of appointment.   GENERAL: vitals reviewed and listed above, alert, oriented, appears well hydrated and in no acute distress  HEENT: atraumatic, conjunttiva clear, no obvious abnormalities on inspection of external nose and ears  NECK: no obvious masses on inspection  MS: moves all extremities without noticeable abnormality, normal gait, no significant deformity of the spine on gross inspection, mild RR L SI jt, normal strength, DTRs, sensitivity to light touch throughout in the bilateral lower extremities, neg SLRT, neg CLRT, neg FABER, Neg FADIR, L 4-5 RrSl, R ant inn  PSYCH: pleasant and cooperative, no obvious depression or anxiety  ASSESSMENT AND PLAN:  Discussed the following assessment and plan:  Left-sided low back pain with left-sided sciatica, unspecified chronicity  Somatic dysfunction of pelvis region  Somatic dysfunction of spine, lumbar  -we discussed possible serious and likely etiologies, workup and treatment, treatment risks and return precautions; suspect lumbar DDD, possible radicular symptoms for  sacroiliitis vs SI jt OA at times. Doing well currently. Somatic dysfunction on exam. -after this discussion, Eriko opted for Home exercises and possible PT, continuation conservative measures, OMM (see below), further imaging lumbar spine If worsening or not well controlled with these measures. -follow up advised 1 month or if wishes to do further OMM in 1-3 weels -of course, we advised Sherisse  to return or notify a doctor immediately if  symptoms worsen or persist or new concerns arise.  PROCEDURE NOTE : OSTEOPATHIC TREATMENT The decision today to treat with gentle Osteopathic Manipulative Therapy  (OMT) was based on physical exam findings, diagnoses and patient wishes. Verbal consent was obtained after after explanation of risks and benefits. No Cervical HVLA manipulation was performed. After consent was obtained, treatment was  performed as below:      Regions treated:  Lumbar pelvic     Techniques used: ME, BLT, MR The patient tolerated the treatment well and reported Improved  symptoms following treatment today. Follow up treatment was advised in: 1-2 weeks   There are no Patient Instructions on file for this visit.  Terressa Koyanagi, DO

## 2018-04-16 ENCOUNTER — Encounter: Payer: Self-pay | Admitting: Family Medicine

## 2018-04-20 ENCOUNTER — Telehealth: Payer: Self-pay | Admitting: *Deleted

## 2018-04-20 NOTE — Telephone Encounter (Signed)
Joan Edwards, can you please help [place] schedule these appts given the time that she is requesting and Dr Elmyra Ricks schedule not being available in some of those slots.    Copied from CRM [id number]. Topic: Appointment Scheduling - Scheduling Inquiry for Clinic >> Apr 14, 2018  4:51 PM [name] wrote: Reason for CRM:   Pt believes she is supposed to schedule 4 OMT appts (one a week for the next four weeks).  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 Pt can be reached at [phone] >> Apr 20, 2018  1:52 PM [name], NT wrote: Pt calling to check status on these appts getting scheduled.

## 2018-04-20 NOTE — Telephone Encounter (Signed)
I called the pt and she stated she would like to schedule only 3 visits at this time.  I informed her the openings available for the time she needs will be on 2/11, 2/18, and 2/25 at 4:30pm.  Appt times were approved and message forwarded to the office manager to open as these are same day slots.

## 2018-04-25 ENCOUNTER — Encounter: Payer: Self-pay | Admitting: Family Medicine

## 2018-04-25 ENCOUNTER — Ambulatory Visit: Payer: Federal, State, Local not specified - PPO | Admitting: Family Medicine

## 2018-04-25 VITALS — BP 110/82 | HR 80 | Temp 97.8°F | Ht 62.25 in

## 2018-04-25 DIAGNOSIS — M9903 Segmental and somatic dysfunction of lumbar region: Secondary | ICD-10-CM | POA: Diagnosis not present

## 2018-04-25 DIAGNOSIS — M9905 Segmental and somatic dysfunction of pelvic region: Secondary | ICD-10-CM | POA: Diagnosis not present

## 2018-04-25 DIAGNOSIS — M9902 Segmental and somatic dysfunction of thoracic region: Secondary | ICD-10-CM

## 2018-04-25 DIAGNOSIS — M9901 Segmental and somatic dysfunction of cervical region: Secondary | ICD-10-CM

## 2018-04-25 DIAGNOSIS — M545 Low back pain, unspecified: Secondary | ICD-10-CM

## 2018-04-25 DIAGNOSIS — G8929 Other chronic pain: Secondary | ICD-10-CM

## 2018-04-25 MED ORDER — TIZANIDINE HCL 2 MG PO TABS: 2.0000 mg | ORAL_TABLET | Freq: Every evening | ORAL | 0 refills | Status: DC | PRN
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
## 2018-04-30 ENCOUNTER — Other Ambulatory Visit: Payer: Self-pay | Admitting: Family Medicine

## 2018-05-02 ENCOUNTER — Ambulatory Visit: Payer: Federal, State, Local not specified - PPO | Admitting: Family Medicine

## 2018-05-02 ENCOUNTER — Encounter: Payer: Self-pay | Admitting: Family Medicine

## 2018-05-02 VITALS — BP 100/60 | HR 77 | Temp 98.2°F | Ht 62.25 in

## 2018-05-02 DIAGNOSIS — M545 Low back pain: Secondary | ICD-10-CM | POA: Diagnosis not present

## 2018-05-02 DIAGNOSIS — M9902 Segmental and somatic dysfunction of thoracic region: Secondary | ICD-10-CM | POA: Diagnosis not present

## 2018-05-02 DIAGNOSIS — M9901 Segmental and somatic dysfunction of cervical region: Secondary | ICD-10-CM

## 2018-05-02 DIAGNOSIS — M9900 Segmental and somatic dysfunction of head region: Secondary | ICD-10-CM | POA: Diagnosis not present

## 2018-05-02 DIAGNOSIS — G8929 Other chronic pain: Secondary | ICD-10-CM

## 2018-05-02 DIAGNOSIS — M9903 Segmental and somatic dysfunction of lumbar region: Secondary | ICD-10-CM

## 2018-05-02 DIAGNOSIS — M9905 Segmental and somatic dysfunction of pelvic region: Secondary | ICD-10-CM
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
## 2018-05-09 ENCOUNTER — Ambulatory Visit: Payer: Federal, State, Local not specified - PPO | Admitting: Family Medicine

## 2018-06-06 ENCOUNTER — Ambulatory Visit: Payer: Federal, State, Local not specified - PPO | Admitting: Physical Therapy

## 2018-08-18 ENCOUNTER — Other Ambulatory Visit: Payer: Self-pay | Admitting: Obstetrics and Gynecology

## 2018-08-18 DIAGNOSIS — Z1231 Encounter for screening mammogram for malignant neoplasm of breast: Secondary | ICD-10-CM

## 2018-08-23 DIAGNOSIS — H04123 Dry eye syndrome of bilateral lacrimal glands: Secondary | ICD-10-CM | POA: Diagnosis not present

## 2018-10-04 ENCOUNTER — Ambulatory Visit
Admission: RE | Admit: 2018-10-04 | Discharge: 2018-10-04 | Disposition: A | Discharge status: Home / Self Care | Payer: Federal, State, Local not specified - PPO | Source: Ambulatory Visit | Attending: Obstetrics and Gynecology | Admitting: Obstetrics and Gynecology

## 2018-10-04 ENCOUNTER — Other Ambulatory Visit: Payer: Self-pay

## 2018-10-04 DIAGNOSIS — Z1231 Encounter for screening mammogram for malignant neoplasm of breast: Secondary | ICD-10-CM

## 2018-10-25 ENCOUNTER — Other Ambulatory Visit: Payer: Self-pay | Admitting: Family Medicine

## 2018-11-19 ENCOUNTER — Other Ambulatory Visit: Payer: Self-pay | Admitting: Family Medicine

## 2018-11-27 DIAGNOSIS — J309 Allergic rhinitis, unspecified: Secondary | ICD-10-CM | POA: Diagnosis not present

## 2018-11-27 DIAGNOSIS — Z1389 Encounter for screening for other disorder: Secondary | ICD-10-CM | POA: Diagnosis not present

## 2018-11-27 DIAGNOSIS — M8580 Other specified disorders of bone density and structure, unspecified site: Secondary | ICD-10-CM | POA: Diagnosis not present

## 2019-02-04 DIAGNOSIS — J069 Acute upper respiratory infection, unspecified: Secondary | ICD-10-CM | POA: Diagnosis not present

## 2019-02-04 DIAGNOSIS — R05 Cough: Secondary | ICD-10-CM | POA: Diagnosis not present

## 2019-02-04 DIAGNOSIS — R519 Headache, unspecified: Secondary | ICD-10-CM | POA: Diagnosis not present

## 2019-02-04 DIAGNOSIS — Z20828 Contact with and (suspected) exposure to other viral communicable diseases: Secondary | ICD-10-CM | POA: Diagnosis not present

## 2019-02-05 DIAGNOSIS — U071 COVID-19: Secondary | ICD-10-CM | POA: Diagnosis not present

## 2019-02-19 DIAGNOSIS — Z131 Encounter for screening for diabetes mellitus: Secondary | ICD-10-CM | POA: Diagnosis not present

## 2019-02-19 DIAGNOSIS — Z13220 Encounter for screening for lipoid disorders: Secondary | ICD-10-CM | POA: Diagnosis not present

## 2019-02-19 DIAGNOSIS — Z0000 Encounter for general adult medical examination without abnormal findings: Secondary | ICD-10-CM | POA: Diagnosis not present

## 2019-02-21 ENCOUNTER — Other Ambulatory Visit: Payer: Self-pay | Admitting: Internal Medicine

## 2019-02-21 DIAGNOSIS — M8580 Other specified disorders of bone density and structure, unspecified site: Secondary | ICD-10-CM

## 2019-03-08 DIAGNOSIS — Z8619 Personal history of other infectious and parasitic diseases: Secondary | ICD-10-CM | POA: Diagnosis not present

## 2019-03-08 DIAGNOSIS — R110 Nausea: Secondary | ICD-10-CM | POA: Diagnosis not present

## 2019-04-25 ENCOUNTER — Telehealth: Payer: Self-pay

## 2019-04-25 NOTE — Telephone Encounter (Signed)
Pt called to cancel her app for her covid vaccine scheduled for 2/13. She is not 63 yet and wants to make sure she don't take a spot for anyone who needs it.   Will have nurse cancel app.  Joycelyn Rua Hopkins

## 2019-04-25 NOTE — Telephone Encounter (Signed)
Contacted pt to verify she would to cancel her appt for COVID vaccine; she confirmed; appt cancelled per request.

## 2019-04-25 NOTE — Telephone Encounter (Signed)
Attempted to contact pt; left message on voicemail of home phone.

## 2019-04-28 ENCOUNTER — Ambulatory Visit: Payer: Federal, State, Local not specified - PPO

## 2019-05-10 ENCOUNTER — Ambulatory Visit: Payer: Federal, State, Local not specified - PPO | Attending: Internal Medicine

## 2019-05-10 DIAGNOSIS — Z23 Encounter for immunization: Secondary | ICD-10-CM | POA: Insufficient documentation

## 2019-05-10 NOTE — Progress Notes (Signed)
   Covid-19 Vaccination Clinic  Name:  Joan Edwards    MRN: [id number] DOB: [birth year]  05/10/2019  Joan Edwards was observed post Covid-19 immunization for 15 minutes without incidence. She was provided with Vaccine Information Sheet and instruction to access the V-Safe system.   Joan Edwards was instructed to call 911 with any severe reactions post vaccine: Marland Kitchen Difficulty breathing  . Swelling of your face and throat  . A fast heartbeat  . A bad rash all over your body  . Dizziness and weakness    Immunizations Administered    Name Date Dose VIS Date Route   Pfizer COVID-19 Vaccine 05/10/2019  8:39 AM 0.3 mL 02/23/2019 Intramuscular   Manufacturer: [employer], [employer]   Lot: [id number]   NDC: [address]-7

## 2019-05-14 ENCOUNTER — Other Ambulatory Visit: Payer: Self-pay

## 2019-05-14 ENCOUNTER — Ambulatory Visit
Admission: RE | Admit: 2019-05-14 | Discharge: 2019-05-14 | Disposition: A | Discharge status: Home / Self Care | Payer: Federal, State, Local not specified - PPO | Source: Ambulatory Visit | Attending: Internal Medicine | Admitting: Internal Medicine

## 2019-05-14 DIAGNOSIS — M810 Age-related osteoporosis without current pathological fracture: Secondary | ICD-10-CM | POA: Diagnosis not present

## 2019-05-14 DIAGNOSIS — Z780 Asymptomatic menopausal state: Secondary | ICD-10-CM | POA: Diagnosis not present

## 2019-05-14 DIAGNOSIS — M85852 Other specified disorders of bone density and structure, left thigh: Secondary | ICD-10-CM | POA: Diagnosis not present

## 2019-05-14 DIAGNOSIS — M8580 Other specified disorders of bone density and structure, unspecified site: Secondary | ICD-10-CM

## 2019-05-15 DIAGNOSIS — M810 Age-related osteoporosis without current pathological fracture: Secondary | ICD-10-CM | POA: Diagnosis not present

## 2019-05-30 ENCOUNTER — Ambulatory Visit: Payer: Federal, State, Local not specified - PPO | Attending: Internal Medicine

## 2019-05-30 DIAGNOSIS — Z23 Encounter for immunization: Secondary | ICD-10-CM

## 2019-05-30 NOTE — Progress Notes (Signed)
   Covid-19 Vaccination Clinic  Name:  Joan Edwards    MRN: [id number] DOB: 05-10-1960  05/30/2019  [name] was observed post Covid-19 immunization for 15 minutes without incident. She was provided with Vaccine Information Sheet and instruction to access the V-Safe system.   [name] was instructed to call 911 with any severe reactions post vaccine: Marland Kitchen Difficulty breathing  . Swelling of face and throat  . A fast heartbeat  . A bad rash all over body  . Dizziness and weakness   Immunizations Administered    Name Date Dose VIS Date Route   Pfizer COVID-19 Vaccine 05/30/2019 10:45 AM 0.3 mL 02/23/2019 Intramuscular   Manufacturer: [employer], [employer]   Lot: [id number]   NDC: [address]-0

## 2019-06-14 DIAGNOSIS — M810 Age-related osteoporosis without current pathological fracture: Secondary | ICD-10-CM | POA: Insufficient documentation

## 2019-06-14 HISTORY — DX: Age-related osteoporosis without current pathological fracture: M81.0

## 2019-09-13 ENCOUNTER — Ambulatory Visit: Payer: Self-pay

## 2019-09-13 ENCOUNTER — Ambulatory Visit: Payer: Federal, State, Local not specified - PPO | Admitting: Family Medicine

## 2019-09-13 ENCOUNTER — Other Ambulatory Visit: Payer: Self-pay

## 2019-09-13 ENCOUNTER — Encounter: Payer: Self-pay | Admitting: Family Medicine

## 2019-09-13 VITALS — BP 112/82 | Ht 62.0 in | Wt 149.0 lb

## 2019-09-13 DIAGNOSIS — M25511 Pain in right shoulder: Secondary | ICD-10-CM

## 2019-09-13 DIAGNOSIS — M949 Disorder of cartilage, unspecified: Secondary | ICD-10-CM

## 2019-09-13 DIAGNOSIS — M7521 Bicipital tendinitis, right shoulder: Secondary | ICD-10-CM | POA: Diagnosis not present

## 2019-09-13 DIAGNOSIS — M899 Disorder of bone, unspecified: Secondary | ICD-10-CM | POA: Diagnosis not present

## 2019-09-13 MED ORDER — VITAMIN D (ERGOCALCIFEROL) 1.25 MG (50000 UNIT) PO CAPS: 50000.0000 [IU] | ORAL_CAPSULE | ORAL | 0 refills | Status: DC

## 2019-09-13 NOTE — Assessment & Plan Note (Signed)
Patient is on Fosamax at this moment.  Need to consider the possibility of lifelong once weekly vitamin D as well.

## 2019-09-13 NOTE — Assessment & Plan Note (Signed)
Bicep tendinitis with likely subluxation.  Discussed compression, home exercises, icing regimen, over-the-counter topical anti-inflammatories.  Muscle relaxer at night.  Follow-up again in 4 to 8 weeks worsening pain consider formal physical therapy and repeat ultrasound

## 2019-09-13 NOTE — Progress Notes (Signed)
[facility] [address] [place] [address] Phone: [phone] Subjective:   Joan Edwards, am serving as a scribe for Dr. Antoine Edwards. This visit occurred during the SARS-CoV-2 public health emergency.  Safety protocols were in place, including screening questions prior to the visit, additional usage of staff PPE, and extensive cleaning of exam room while observing appropriate contact time as indicated for disinfecting solutions.   I'm seeing this patient by the request  of:  Patient, No Pcp Per  CC: Right shoulder pain  [id number]  Joan Edwards is a 59 y.o. female coming in with complaint of right shoulder pain for two months. Carries at heavy bag for school. Pain with IR and extension. Pain radiates from anterior shoulder down into the upper arm. Denies any radiating symptoms further than that.   Does have neck pain. osteoporosis found in her neck. Does get massages 2x a month for tightness.     Past Medical History:  Diagnosis Date  . BREAST MASS, LEFT 04/10/2007   Qualifier: Diagnosis of  By: Lovell Sheehan MD, Balinda Quails    Past Surgical History:  Procedure Laterality Date  . BREAST CYST EXCISION    . DILATION AND CURETTAGE OF UTERUS    . tonsil     Social History   Socioeconomic History  . Marital status: Married    Spouse name: Not on file  . Number of children: Not on file  . Years of education: Not on file  . Highest education level: Not on file  Occupational History  . Not on file  Tobacco Use  . Smoking status: Former Games developer  . Smokeless tobacco: Never Used  . Tobacco comment: light smoker for 15 years, quit at age 6  Vaping Use  . Vaping Use: Never used  Substance and Sexual Activity  . Alcohol use: Yes    Comment: occ, 1-2 glass of wine a few times per week  . Drug use: Never  . Sexual activity: Yes  Other Topics Concern  . 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 Difficulty of Paying Living Expenses:   Food Insecurity:   . Worried About Programme researcher, broadcasting/film/video in the Last Year:   . Barista in the Last Year:   Transportation Needs:   . Freight forwarder (Medical):   Marland Kitchen Lack of Transportation (Non-Medical):   Physical Activity:   . Days of Exercise per Week:   . Minutes of Exercise per Session:   Stress:   . Feeling of Stress :   Social Connections:   . Frequency of Communication with Friends and Family:   . Frequency of Social Gatherings with Friends and Family:   . Attends Religious Services:   . Active Member of Clubs or Organizations:   . Attends Banker Meetings:   Marland Kitchen Marital Status:    Allergies  Allergen Reactions  . Erythromycin     REACTION: Upset GI  . Other     Bananas-hives  . Polymyxin B-Trimethoprim Itching  . Sulfonamide Derivatives  REACTION: itching   Family History  Problem Relation Age of Onset  . Hypertension Mother   . Hypertension Father       Current Outpatient Medications (Respiratory):  .  loratadine (CLARITIN) 10 MG tablet, Take 10 mg by mouth daily. .  pseudoephedrine (SUDAFED) 30 MG tablet, Take 30 mg by mouth every 4 (four) hours as needed for congestion.    Current Outpatient Medications (Other):  .  calcium gluconate 500 MG tablet, Take 1 tablet by mouth daily. .  cholecalciferol (VITAMIN D) 1000 UNITS tablet, Take 1,000 Units by mouth daily. .  famciclovir (FAMVIR) 250 MG tablet, TAKE ONE TABLET BY MOUTH TWICE DAILY FOR 5 DAYS AS NEEDED .  glucosamine-chondroitin 500-400 MG tablet, Take 1 tablet by mouth daily. Marland Kitchen  MAGNESIUM PO, Take by mouth. .  Multiple Vitamin (MULTIVITAMIN) capsule, Take 1 capsule by  mouth daily. .  Omega-3 Fatty Acids (FISH OIL PO), Take by mouth daily. Marland Kitchen  triamcinolone (KENALOG) 0.025 % ointment, APPLY TO AFFECTED AREA TWICE A DAY .  TURMERIC PO, Take by mouth daily. Marland Kitchen  tiZANidine (ZANAFLEX) 2 MG tablet, Take 1 tablet (2 mg total) by mouth at bedtime as needed for muscle spasms. .  Vitamin D, Ergocalciferol, (DRISDOL) 1.25 MG (50000 UNIT) CAPS capsule, Take 1 capsule (50,000 Units total) by mouth every 7 (seven) days.   Reviewed prior external information including notes and imaging from  primary care provider As well as notes that were available from care everywhere and other healthcare systems.  Past medical history, social, surgical and family history all reviewed in electronic medical record.  No pertanent information unless stated regarding to the chief complaint.   Review of Systems:  No headache, visual changes, nausea, vomiting, diarrhea, constipation, dizziness, abdominal pain, skin rash, fevers, chills, night sweats, weight loss, swollen lymph nodes, body aches, joint swelling, chest pain, shortness of breath, mood changes. POSITIVE muscle aches  Objective  Blood pressure 112/82, height 5\' 2"  (1.575 m), weight 149 lb (67.6 kg).   General: No apparent distress alert and oriented x3 mood and affect normal, dressed appropriately.  HEENT: Pupils equal, extraocular movements intact  Respiratory: Patient's speak in full sentences and does not appear short of breath  Cardiovascular: No lower extremity edema, non tender, no erythema  Neuro: Cranial nerves II through XII are intact, neurovascularly intact in all extremities with 2+ DTRs and 2+ pulses.  Gait normal with good balance and coordination.  MSK:   Right shoulder exam does have a positive Speed sign.  Mildly positive impingement noted with Hawkins.  Patient has a negative O'Brien's. Limited musculoskeletal ultrasound was performed and interpreted by  Limited ultrasound of patient's right  shoulder shows there is some hypoechoic changes surrounding the bicep tendon.  Patient's rotator cuff appears to be intact with some very mild degenerative changes but no tear or retraction noted. Impression: Bicep tendinitis    Impression and Recommendations:     The above documentation has been reviewed and is accurate and complete Judi Saa, DO       Note: This dictation was prepared with Dragon dictation along with smaller phrase technology. Any transcriptional errors that result from this process are unintentional.

## 2019-09-13 NOTE — Patient Instructions (Signed)
Arm compression Bicep exercises  Once weekly vitamin D See me again in 5-6 weeks

## 2019-09-18 ENCOUNTER — Other Ambulatory Visit: Payer: Self-pay | Admitting: Obstetrics and Gynecology

## 2019-09-18 DIAGNOSIS — Z1231 Encounter for screening mammogram for malignant neoplasm of breast: Secondary | ICD-10-CM

## 2019-10-12 ENCOUNTER — Other Ambulatory Visit: Payer: Self-pay

## 2019-10-12 ENCOUNTER — Ambulatory Visit
Admission: RE | Admit: 2019-10-12 | Discharge: 2019-10-12 | Disposition: A | Discharge status: Home / Self Care | Payer: Federal, State, Local not specified - PPO | Source: Ambulatory Visit | Attending: Obstetrics and Gynecology | Admitting: Obstetrics and Gynecology

## 2019-10-12 DIAGNOSIS — Z1231 Encounter for screening mammogram for malignant neoplasm of breast: Secondary | ICD-10-CM

## 2019-10-18 ENCOUNTER — Ambulatory Visit: Payer: Federal, State, Local not specified - PPO | Admitting: Family Medicine

## 2019-11-14 NOTE — Progress Notes (Signed)
[name] [address] [place] [address] Phone: [phone] Subjective:   Bruce Donath, am serving as a scribe for Dr. Antoine Primas. This visit occurred during the SARS-CoV-2 public health emergency.  Safety protocols were in place, including screening questions prior to the visit, additional usage of staff PPE, and extensive cleaning of exam room while observing appropriate contact time as indicated for disinfecting solutions.  I'm seeing this patient by the request  of:  Patient, No Pcp Per  CC: Right shoulder pain follow-up, new onset low back pain  [id number]   09/13/2019 Bicep tendinitis with likely subluxation.  Discussed compression, home exercises, icing regimen, over-the-counter topical anti-inflammatories.  Muscle relaxer at night.  Follow-up again in 4 to 8 weeks worsening pain consider formal physical therapy and repeat ultrasound  Patient is on Fosamax at this moment.  Need to consider the possibility of lifelong once weekly vitamin D as well.  Update 11/14/2019 Joan Edwards is a 59 y.o. female coming in with complaint of right shoulder pain. Patient states that she she continues to have pain with putting her purse into back seat from front seat. Also explains that external rotation with pec stretching is bothering her. Overall does feel some improvement.   Low back pain, left side, does yoga a lot.  Has had pain around the sacroiliac joint for quite some time.     Past Medical History:  Diagnosis Date  . BREAST MASS, LEFT 04/10/2007   Qualifier: Diagnosis of  By: Lovell Sheehan MD, Balinda Quails    Past Surgical History:  Procedure Laterality Date  . BREAST CYST EXCISION    . DILATION AND CURETTAGE OF UTERUS    . tonsil     Social History   Socioeconomic History  . Marital status: Married    Spouse name: Not on file  . Number of children: Not on file  . Years of education: Not on file  . Highest education level: Not on  file  Occupational History  . Not on file  Tobacco Use  . Smoking status: Former Games developer  . Smokeless tobacco: Never Used  . Tobacco comment: light smoker for 15 years, quit at age 6  Vaping Use  . Vaping Use: Never used  Substance and Sexual Activity  . Alcohol use: Yes    Comment: occ, 1-2 glass of wine a few times per week  . Drug use: Never  . Sexual activity: Yes  Other Topics Concern  . 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 Difficulty of Paying Living Expenses: Not on file  Food Insecurity:   . Worried About Programme researcher, broadcasting/film/video in the Last Year: Not on file  . Ran Out of Food in the Last Year: Not on file  Transportation Needs:   . Lack of Transportation (Medical): Not on file  . Lack of Transportation (Non-Medical): Not on file  Physical Activity:   . Days of Exercise per Week: Not on file  . Minutes of Exercise per Session: Not on file  Stress:   . Feeling  of Stress : Not on file  Social Connections:   . Frequency of Communication with Friends and Family: Not on file  . Frequency of Social Gatherings with Friends and Family: Not on file  . Attends Religious Services: Not on file  . Active Member of Clubs or Organizations: Not on file  . Attends Banker Meetings: Not on file  . Marital Status: Not on file   Allergies  Allergen Reactions  . Erythromycin     REACTION: Upset GI  . Other     Bananas-hives  . Polymyxin B-Trimethoprim Itching  . Sulfonamide Derivatives     REACTION: itching   Family History  Problem Relation Age of Onset  . Hypertension Mother   . Hypertension Father       Current Outpatient Medications (Respiratory):    .  loratadine (CLARITIN) 10 MG tablet, Take 10 mg by mouth daily. .  pseudoephedrine (SUDAFED) 30 MG tablet, Take 30 mg by mouth every 4 (four) hours as needed for congestion.    Current Outpatient Medications (Other):  .  calcium gluconate 500 MG tablet, Take 1 tablet by mouth daily. .  cholecalciferol (VITAMIN D) 1000 UNITS tablet, Take 1,000 Units by mouth daily. .  famciclovir (FAMVIR) 250 MG tablet, TAKE ONE TABLET BY MOUTH TWICE DAILY FOR 5 DAYS AS NEEDED .  glucosamine-chondroitin 500-400 MG tablet, Take 1 tablet by mouth daily. Marland Kitchen  MAGNESIUM PO, Take by mouth. .  Multiple Vitamin (MULTIVITAMIN) capsule, Take 1 capsule by mouth daily. .  Omega-3 Fatty Acids (FISH OIL PO), Take by mouth daily. Marland Kitchen  triamcinolone (KENALOG) 0.025 % ointment, APPLY TO AFFECTED AREA TWICE A DAY .  TURMERIC PO, Take by mouth daily. .  Vitamin D, Ergocalciferol, (DRISDOL) 1.25 MG (50000 UNIT) CAPS capsule, Take 1 capsule (50,000 Units total) by mouth every 7 (seven) days. Marland Kitchen  tiZANidine (ZANAFLEX) 2 MG tablet, Take 1 tablet (2 mg total) by mouth at bedtime as needed for muscle spasms.   Reviewed prior external information including notes and imaging from  primary care provider As well as notes that were available from care everywhere and other healthcare systems.  Past medical history, social, surgical and family history all reviewed in electronic medical record.  No pertanent information unless stated regarding to the chief complaint.   Review of Systems:  No headache, visual changes, nausea, vomiting, diarrhea, constipation, dizziness, abdominal pain, skin rash, fevers, chills, night sweats, weight loss, swollen lymph nodes, body aches, joint swelling, chest pain, shortness of breath, mood changes. POSITIVE muscle aches  Objective  Blood pressure 122/82, pulse 86, height 5\' 2"  (1.575 m), weight 145 lb (65.8 kg), SpO2 98 %.   General: No apparent distress alert and oriented x3 mood and affect normal,  dressed appropriately.  HEENT: Pupils equal, extraocular movements intact  Respiratory: Patient's speak in full sentences and does not appear short of breath  Cardiovascular: No lower extremity edema, non tender, no erythema  Neuro: Cranial nerves II through XII are intact, neurovascularly intact in all extremities with 2+ DTRs and 2+ pulses.  Gait normal with good balance and coordination.  MSK:  Right shoulder exam still has a positive speeds test.  4+ out of 5 strength of the rotator cuff.  Near full range of motion no noted. Patient is low back does show some tightness noted around the left sacroiliac joint.  Patient has what appears to be a fairly low floating rib noted on the left side.  This is worsened somewhat  patient is having pain as well.  Negative straight leg test.  Mild increase in discomfort with tenderness over the sacroiliac joint itself.   Limited musculoskeletal ultrasound was performed and interpreted by Judi Saa  Limited ultrasound of patient's right shoulder shows some hypoechoic changes still within the bicep tendon.  Patient does have what appears to be some intrasubstance tearing noted where it seems to insert with the anterior labrum noted.  Mild increase in Doppler flow in the area with neovascularization.   Impression and Recommendations:     The above documentation has been reviewed and is accurate and complete Judi Saa, DO       Note: This dictation was prepared with Dragon dictation along with smaller phrase technology. Any transcriptional errors that result from this process are unintentional.

## 2019-11-15 ENCOUNTER — Other Ambulatory Visit: Payer: Self-pay

## 2019-11-15 ENCOUNTER — Ambulatory Visit (INDEPENDENT_AMBULATORY_CARE_PROVIDER_SITE_OTHER): Payer: Federal, State, Local not specified - PPO

## 2019-11-15 ENCOUNTER — Ambulatory Visit: Payer: Federal, State, Local not specified - PPO | Admitting: Family Medicine

## 2019-11-15 ENCOUNTER — Ambulatory Visit: Payer: Self-pay

## 2019-11-15 ENCOUNTER — Encounter: Payer: Self-pay | Admitting: Family Medicine

## 2019-11-15 VITALS — BP 122/82 | HR 86 | Ht 62.0 in | Wt 145.0 lb

## 2019-11-15 DIAGNOSIS — M899 Disorder of bone, unspecified: Secondary | ICD-10-CM | POA: Diagnosis not present

## 2019-11-15 DIAGNOSIS — G8929 Other chronic pain: Secondary | ICD-10-CM | POA: Diagnosis not present

## 2019-11-15 DIAGNOSIS — M533 Sacrococcygeal disorders, not elsewhere classified: Secondary | ICD-10-CM | POA: Diagnosis not present

## 2019-11-15 DIAGNOSIS — M949 Disorder of cartilage, unspecified: Secondary | ICD-10-CM

## 2019-11-15 DIAGNOSIS — M25511 Pain in right shoulder: Secondary | ICD-10-CM

## 2019-11-15 DIAGNOSIS — M7521 Bicipital tendinitis, right shoulder: Secondary | ICD-10-CM | POA: Diagnosis not present

## 2019-11-15 NOTE — Patient Instructions (Signed)
Shoulder looks better We will give it 2 more months At that time we will ultrasound and decide between nitro patch and PT Tried manipulation of SI joint 2 months

## 2019-11-15 NOTE — Assessment & Plan Note (Signed)
Improvement noted, patient is making some progress.  Discussed once again about the possibility of formal physical therapy or even nitroglycerin.  Patient wants to continue with conservative therapy taking care of an ailing family member that has taken patient out of doing the exercises on a regular basis.  Follow-up again in 2 months

## 2019-11-15 NOTE — Assessment & Plan Note (Signed)
Hypermobility noted

## 2019-11-15 NOTE — Assessment & Plan Note (Signed)
New problem, hip abductor weakness, need x-rays, discussed icing regimen and home exercises, patient may be a candidate for also manipulation at follow-up follow-up again in 2 months

## 2019-11-29 ENCOUNTER — Other Ambulatory Visit: Payer: Self-pay | Admitting: Family Medicine

## 2019-12-26 DIAGNOSIS — Z01419 Encounter for gynecological examination (general) (routine) without abnormal findings: Secondary | ICD-10-CM | POA: Diagnosis not present

## 2019-12-26 DIAGNOSIS — Z1151 Encounter for screening for human papillomavirus (HPV): Secondary | ICD-10-CM | POA: Diagnosis not present

## 2020-01-15 ENCOUNTER — Other Ambulatory Visit: Payer: Self-pay

## 2020-01-15 ENCOUNTER — Encounter: Payer: Self-pay | Admitting: Family Medicine

## 2020-01-15 ENCOUNTER — Ambulatory Visit: Payer: Self-pay

## 2020-01-15 ENCOUNTER — Ambulatory Visit: Payer: Federal, State, Local not specified - PPO | Admitting: Family Medicine

## 2020-01-15 ENCOUNTER — Ambulatory Visit (INDEPENDENT_AMBULATORY_CARE_PROVIDER_SITE_OTHER): Payer: Federal, State, Local not specified - PPO

## 2020-01-15 VITALS — BP 112/84 | HR 69 | Ht 62.0 in | Wt 144.0 lb

## 2020-01-15 DIAGNOSIS — M949 Disorder of cartilage, unspecified: Secondary | ICD-10-CM

## 2020-01-15 DIAGNOSIS — M533 Sacrococcygeal disorders, not elsewhere classified: Secondary | ICD-10-CM

## 2020-01-15 DIAGNOSIS — M79601 Pain in right arm: Secondary | ICD-10-CM | POA: Diagnosis not present

## 2020-01-15 DIAGNOSIS — M899 Disorder of bone, unspecified: Secondary | ICD-10-CM | POA: Diagnosis not present

## 2020-01-15 DIAGNOSIS — M545 Low back pain, unspecified: Secondary | ICD-10-CM

## 2020-01-15 DIAGNOSIS — M7521 Bicipital tendinitis, right shoulder: Secondary | ICD-10-CM

## 2020-01-15 NOTE — Assessment & Plan Note (Signed)
On ultrasound seems that patient is doing relatively well.  Patient does have significant decrease in hypoechoic changes that were noted previously on the bicep tendon.  Patient is making progress and do not think that any further imaging would change medical management.  Patient will be going to physical therapy for the back and if having increasing any pain in the shoulder we will start for the shoulder as well.  Follow-up again in 8 weeks

## 2020-01-15 NOTE — Assessment & Plan Note (Signed)
Continuing to have difficulty.  Patient unfortunately continues to have this pain even though she does do yoga significant amount and does have seen decent muscle imbalances.  Patient will continue to increase activity but I do think formal physical therapy will be very beneficial.  Follow-up again in 8 weeks.

## 2020-01-15 NOTE — Assessment & Plan Note (Signed)
Osteoporosis.  Patient continues on the vitamin D

## 2020-01-15 NOTE — Progress Notes (Signed)
[name] [address] [place] [address] Phone: [phone] Subjective:   Bruce Donath, am serving as a scribe for Dr. Antoine Primas. This visit occurred during the SARS-CoV-2 public health emergency.  Safety protocols were in place, including screening questions prior to the visit, additional usage of staff PPE, and extensive cleaning of exam room while observing appropriate contact time as indicated for disinfecting solutions.   I'm seeing this patient by the request  of:  Patient, No Pcp Per  CC: Right shoulder pain follow-up and low back pain follow-up  JAS:[id number]   11/15/2019 Improvement noted, patient is making some progress.  Discussed once again about the possibility of formal physical therapy or even nitroglycerin.  Patient wants to continue with conservative therapy taking care of an ailing family member that has taken patient out of doing the exercises on a regular basis.  Follow-up again in 2 months   New problem, hip abductor weakness, need x-rays, discussed icing regimen and home exercises, patient may be a candidate for also manipulation at follow-up follow-up again in 2 months  Update 01/15/2020 Joan Edwards is a 59 y.o. female coming in with complaint of right biceps pain and SI joint pain. Patient states that her pain had improved but her pain has gotten worse recently. She feels that her dog pulling on her arm has exacerbated her pain. Pain worse at night. Pain in anterior aspect.   Back pain is improving. Has been doing HEP and other yoga stretches. Patient wants to know what else she can do to decrease her pain of left SI joint.  Patient states that has been going on for quite some time.  And sometimes can be very irritating.  Does not stop her from activity and has not taken any medications for it really.     Past Medical History:  Diagnosis Date  . BREAST MASS, LEFT 04/10/2007   Qualifier: Diagnosis of  By:  Lovell Sheehan MD, Balinda Quails    Past Surgical History:  Procedure Laterality Date  . BREAST CYST EXCISION    . DILATION AND CURETTAGE OF UTERUS    . tonsil     Social History   Socioeconomic History  . Marital status: Married    Spouse name: Not on file  . Number of children: Not on file  . Years of education: Not on file  . Highest education level: Not on file  Occupational History  . Not on file  Tobacco Use  . Smoking status: Former Games developer  . Smokeless tobacco: Never Used  . Tobacco comment: light smoker for 15 years, quit at age 48  Vaping Use  . Vaping Use: Never used  Substance and Sexual Activity  . Alcohol use: Yes    Comment: occ, 1-2 glass of wine a few times per week  . Drug use: Never  . Sexual activity: Yes  Other Topics Concern  . 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 Difficulty of Paying Living Expenses: Not on file  Food Insecurity:   . Worried About Radiation protection practitioner  of Food in the Last Year: Not on file  . Ran Out of Food in the Last Year: Not on file  Transportation Needs:   . Lack of Transportation (Medical): Not on file  . Lack of Transportation (Non-Medical): Not on file  Physical Activity:   . Days of Exercise per Week: Not on file  . Minutes of Exercise per Session: Not on file  Stress:   . Feeling of Stress : Not on file  Social Connections:   . Frequency of Communication with Friends and Family: Not on file  . Frequency of Social Gatherings with Friends and Family: Not on file  . Attends Religious Services: Not on file  . Active Member of Clubs or Organizations: Not on file  . Attends Banker Meetings: Not on file  . Marital Status:  Not on file   Allergies  Allergen Reactions  . Erythromycin     REACTION: Upset GI  . Other     Bananas-hives  . Polymyxin B-Trimethoprim Itching  . Sulfonamide Derivatives     REACTION: itching   Family History  Problem Relation Age of Onset  . Hypertension Mother   . Hypertension Father       Current Outpatient Medications (Respiratory):  .  loratadine (CLARITIN) 10 MG tablet, Take 10 mg by mouth daily. .  pseudoephedrine (SUDAFED) 30 MG tablet, Take 30 mg by mouth every 4 (four) hours as needed for congestion.    Current Outpatient Medications (Other):  .  calcium gluconate 500 MG tablet, Take 1 tablet by mouth daily. .  cholecalciferol (VITAMIN D) 1000 UNITS tablet, Take 1,000 Units by mouth daily. .  famciclovir (FAMVIR) 250 MG tablet, TAKE ONE TABLET BY MOUTH TWICE DAILY FOR 5 DAYS AS NEEDED .  glucosamine-chondroitin 500-400 MG tablet, Take 1 tablet by mouth daily. Marland Kitchen  MAGNESIUM PO, Take by mouth. .  Multiple Vitamin (MULTIVITAMIN) capsule, Take 1 capsule by mouth daily. .  Omega-3 Fatty Acids (FISH OIL PO), Take by mouth daily. Marland Kitchen  triamcinolone (KENALOG) 0.025 % ointment, APPLY TO AFFECTED AREA TWICE A DAY .  TURMERIC PO, Take by mouth daily. .  Vitamin D, Ergocalciferol, (DRISDOL) 1.25 MG (50000 UNIT) CAPS capsule, TAKE 1 CAPSULE (50,000 UNITS TOTAL) BY MOUTH EVERY 7 (SEVEN) DAYS. Marland Kitchen  tiZANidine (ZANAFLEX) 2 MG tablet, Take 1 tablet (2 mg total) by mouth at bedtime as needed for muscle spasms.   Reviewed prior external information including notes and imaging from  primary care provider As well as notes that were available from care everywhere and other healthcare systems.  Past medical history, social, surgical and family history all reviewed in electronic medical record.  No pertanent information unless stated regarding to the chief complaint.   Review of Systems:  No headache, visual changes, nausea, vomiting, diarrhea, constipation, dizziness, abdominal pain,  skin rash, fevers, chills, night sweats, weight loss, swollen lymph nodes, body aches, joint swelling, chest pain, shortness of breath, mood changes. POSITIVE muscle aches  Objective  Blood pressure 112/84, pulse 69, height 5\' 2"  (1.575 m), weight 144 lb (65.3 kg), SpO2 99 %.   General: No apparent distress alert and oriented x3 mood and affect normal, dressed appropriately.  HEENT: Pupils equal, extraocular movements intact  Respiratory: Patient's speak in full sentences and does not appear short of breath  Cardiovascular: No lower extremity edema, non tender, no erythema  Gait antalgic MSK: Right shoulder exam shows the patient still has a very mild positive Yergason's test but improved from  previous exam.  Very mild positive impingement.  Rotator cuff appears to be intact with 5 out of 5 strength.  Good range of motion but does have some very mild pulling noted on internal rotation of the shoulder.  Low back exam shows the patient does have some very mild increase in lordosis at the lumbosacral area.  Mild increase in discomfort over the left sacroiliac joint.  Negative both Pearlean Brownie.  Negative straight leg test.  Tightness noted in the paraspinal musculature of the lumbar spine left side.  Full range of motion no noted of the back  Limited musculoskeletal ultrasound was performed and interpreted by Judi Saa  Limited ultrasound of patient's right shoulder shows that there is significant decrease in the hypoechoic changes that were around the bicep tendon previously.  Patient does still have some mild degenerative changes of the tendon but no increase in Doppler flow or neovascularization.  Mild underlying narrowing of the glenohumeral joint noted.  Patient's rotator cuff does not have any true acute tear appreciated but does have a very mild subacromial bursitis noted in the supraspinatus view. Impression: Interval improvement of the bicep tendinitis with mild reactive bursitis      Impression and Recommendations:     The above documentation has been reviewed and is accurate and complete Judi Saa, DO

## 2020-01-15 NOTE — Patient Instructions (Addendum)
PT [facility] today See me again in 8 weeks

## 2020-02-05 DIAGNOSIS — Z20822 Contact with and (suspected) exposure to covid-19: Secondary | ICD-10-CM | POA: Diagnosis not present

## 2020-02-16 ENCOUNTER — Other Ambulatory Visit: Payer: Self-pay | Admitting: Family Medicine

## 2020-02-27 DIAGNOSIS — Z20822 Contact with and (suspected) exposure to covid-19: Secondary | ICD-10-CM | POA: Diagnosis not present

## 2020-03-11 ENCOUNTER — Ambulatory Visit: Payer: Federal, State, Local not specified - PPO | Admitting: Family Medicine

## 2020-03-20 ENCOUNTER — Ambulatory Visit: Payer: Federal, State, Local not specified - PPO | Attending: Family Medicine | Admitting: Physical Therapy

## 2020-03-20 ENCOUNTER — Encounter: Payer: Self-pay | Admitting: Physical Therapy

## 2020-03-20 ENCOUNTER — Other Ambulatory Visit: Payer: Self-pay

## 2020-03-20 DIAGNOSIS — G8929 Other chronic pain: Secondary | ICD-10-CM | POA: Diagnosis not present

## 2020-03-20 DIAGNOSIS — M62830 Muscle spasm of back: Secondary | ICD-10-CM

## 2020-03-20 DIAGNOSIS — M545 Low back pain, unspecified: Secondary | ICD-10-CM | POA: Insufficient documentation

## 2020-03-20 DIAGNOSIS — M6281 Muscle weakness (generalized): Secondary | ICD-10-CM | POA: Diagnosis not present
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
## 2020-03-20 NOTE — Patient Instructions (Signed)
     Trigger Point Dry Needling  . What is Trigger Point Dry Needling (DN)? o DN is a physical therapy technique used to treat muscle pain and dysfunction. Specifically, DN helps deactivate muscle trigger points (muscle knots).  o A thin filiform needle is used to penetrate the skin and stimulate the underlying trigger point. The goal is for a local twitch response (LTR) to occur and for the trigger point to relax. No medication of any kind is injected during the procedure.   . What Does Trigger Point Dry Needling Feel Like?  o The procedure feels different for each individual patient. Some patients report that they do not actually feel the needle enter the skin and overall the process is not painful. Very mild bleeding may occur. However, many patients feel a deep cramping in the muscle in which the needle was inserted. This is the local twitch response.   . How Will I feel after the treatment? o Soreness is normal, and the onset of soreness may not occur for a few hours. Typically this soreness does not last longer than two days.  o Bruising is uncommon, however; ice can be used to decrease any possible bruising.  o In rare cases feeling tired or nauseous after the treatment is normal. In addition, your symptoms may get worse before they get better, this period will typically not last longer than 24 hours.   . What Can I do After My Treatment? o Increase your hydration by drinking more water for the next 24 hours. o You may place ice or heat on the areas treated that have become sore, however, do not use heat on inflamed or bruised areas. Heat often brings more relief post needling. o You can continue your regular activities, but vigorous activity is not recommended initially after the treatment for 24 hours. o DN is best combined with other physical therapy such as strengthening, stretching, and other therapies.    Iam Lipson PT [facility] [address], [address] [place], [place] [address] Phone # [phone] Fax [phone] 

## 2020-03-25 ENCOUNTER — Ambulatory Visit: Payer: Federal, State, Local not specified - PPO | Admitting: Family Medicine

## 2020-04-02 ENCOUNTER — Encounter: Payer: Federal, State, Local not specified - PPO | Admitting: Physical Therapy

## 2020-04-03 ENCOUNTER — Other Ambulatory Visit: Payer: Self-pay

## 2020-04-03 ENCOUNTER — Ambulatory Visit: Payer: Federal, State, Local not specified - PPO | Admitting: Physical Therapy

## 2020-04-03 DIAGNOSIS — M6281 Muscle weakness (generalized): Secondary | ICD-10-CM

## 2020-04-03 DIAGNOSIS — G8929 Other chronic pain: Secondary | ICD-10-CM

## 2020-04-03 DIAGNOSIS — M62830 Muscle spasm of back: Secondary | ICD-10-CM | POA: Diagnosis not present

## 2020-04-03 DIAGNOSIS — M545 Low back pain, unspecified: Secondary | ICD-10-CM | POA: Diagnosis not present

## 2020-04-03 NOTE — Patient Instructions (Signed)
Access Code: [email or web address] URL: [email or web address] Date: 04/03/2020 Prepared by: Lavinia Sharps  Exercises Hooklying Transversus Abdominis Palpation - 1 x daily - 7 x weekly - 3 sets - 10 reps Supine 90/90 Shoulder Flexion with Abdominal Bracing - 1 x daily - 7 x weekly - 3 sets - 10 reps Prone Hip Extension - One Pillow - 1 x daily - 7 x weekly - 1 sets - 5 reps Prone Hip Extension with Bent Knee - One Pillow - 1 x daily - 7 x weekly - 1 sets - 5 reps Prone W Scapular Retraction - 1 x daily - 7 x weekly - 1 sets - 5 reps Prone Single Arm Shoulder Abduction - 1 x daily - 7 x weekly - 1 sets - 15 reps
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
## 2020-04-09 ENCOUNTER — Other Ambulatory Visit: Payer: Self-pay

## 2020-04-09 ENCOUNTER — Ambulatory Visit: Payer: Federal, State, Local not specified - PPO | Admitting: Physical Therapy

## 2020-04-09 ENCOUNTER — Encounter: Payer: Self-pay | Admitting: Physical Therapy

## 2020-04-09 DIAGNOSIS — M545 Low back pain, unspecified: Secondary | ICD-10-CM | POA: Diagnosis not present

## 2020-04-09 DIAGNOSIS — M62830 Muscle spasm of back: Secondary | ICD-10-CM

## 2020-04-09 DIAGNOSIS — M6281 Muscle weakness (generalized): Secondary | ICD-10-CM

## 2020-04-09 DIAGNOSIS — G8929 Other chronic pain: Secondary | ICD-10-CM | POA: Diagnosis not present

## 2020-04-09 NOTE — Therapy (Signed)
[facility] [address], [address] [place], [place], [address] Phone: [phone]   Fax:  [phone]  Physical Therapy Treatment  Patient Details  Name: Joan Edwards MRN: [id number] Date of Birth: 04-Dec-1960 Referring Provider (PT): Dr. Antoine Primas   Encounter Date: 04/09/2020   PT End of Session - 04/09/20 1101    Visit Number 3    Date for PT Re-Evaluation 06/12/20    Authorization Type BCBS    PT Start Time 1101    PT Stop Time 1153    PT Time Calculation (min) 52 min    Activity Tolerance Patient tolerated treatment well    Behavior During Therapy [facility] for tasks assessed/performed           Past Medical History:  Diagnosis Date  . BREAST MASS, LEFT 04/10/2007   Qualifier: Diagnosis of  By: Lovell Sheehan MD, Balinda Quails     Past Surgical History:  Procedure Laterality Date  . BREAST CYST EXCISION    . DILATION AND CURETTAGE OF UTERUS    . tonsil      There were no vitals filed for this visit.   Subjective Assessment - 04/09/20 1101    Subjective Walking around doesn't hurt. The DN is helping but overall pain is not improving.    Patient Stated Goals my core needs to be strengthened;  good exercises b/c of neck; better things to do for my hip; strengthen that whole area;  preventitive for osteoporosis    Currently in Pain? Yes    Pain Score 4     Pain Location Back    Pain Orientation Left                             [facility] Adult PT Treatment/Exercise - 04/09/20 0001      Exercises   Exercises Lumbar      Lumbar Exercises: Stretches   Hip Flexor Stretch Left;1 rep    Hip Flexor Stretch Limitations warrior with right SB and heel raises x 10    Standing Side Bend Left;1 rep;20 seconds    Standing Side Bend Limitations in doorway    Other Lumbar Stretch Exercise supine reverse C position hips left/shoulders and feet right; also standing forward bend with straight spine hands on sink  ("7" posiition)      Lumbar Exercises: Standing   Row Both;10 reps    Theraband Level (Row) Level 3 (Green)    Shoulder Extension Both;10 reps    Theraband Level (Shoulder Extension) Level 3 (Green)    Other Standing Lumbar Exercises horizontal ABD, diagonals all x 10 with green      Moist Heat Therapy   Number Minutes Moist Heat 3 Minutes    Moist Heat Location Lumbar Spine      Manual Therapy   Manual Therapy Soft tissue mobilization    Manual therapy comments Skilled palpation and monitoring of soft tissues during DN    Soft tissue mobilization left QL, sidelying left paraspinals, left glutes            Trigger Point Dry Needling - 04/09/20 0001    Consent Given? Yes    Education Handout Provided Previously provided    Muscles Treated Back/Hip Gluteus minimus;Gluteus medius;Piriformis;Erector spinae;Lumbar multifidi;Thoracic multifidi    Dry Needling Comments left    Gluteus Minimus Response Palpable increased muscle length    Gluteus Medius Response Palpable increased muscle length    Piriformis  Response Palpable increased muscle length    Lumbar multifidi Response Palpable increased muscle length;Twitch response elicited    Quadratus Lumborum Response Twitch response elicited;Palpable increased muscle length    Thoracic multifidi response Palpable increased muscle length   T11 and T12               PT Education - 04/09/20 1309    Education Details HEP upper back    Person(s) Educated Patient    Methods Explanation;Demonstration;Handout    Comprehension Verbalized understanding;Returned demonstration            PT Short Term Goals - 03/20/20 2212      PT SHORT TERM GOAL #1   Title The patient will demonstrate basic core/stability ex's appropriate with  osteoporosis    Time 6    Period Weeks    Status New    Target Date 05/01/20      PT SHORT TERM GOAL #2   Title The patient will report a 30% reduction in left low back pain with sitting/riding in the  car for traveling    Time 6    Period Weeks    Status New             PT Long Term Goals - 03/20/20 2215      PT LONG TERM GOAL #1   Title The patient will be independent in safe self progression of HEP    Time 12    Period Weeks    Status New    Target Date 06/12/20      PT LONG TERM GOAL #2   Title Left hip abduction. extensor and trunk strength grossly 4+/5 to 5/5 needed for stability and future ability to remain functioning at a high level    Time 12    Period Weeks    Status New      PT LONG TERM GOAL #3   Title The patient will report a 60% improvement in left back back pain with sitting/riding in the car    Time 12    Period Weeks    Status New      PT LONG TERM GOAL #4   Title FOTO functional outcome score improved to > 84% for continued performance of high level activity    Time 12    Period Weeks    Status New                 Plan - 04/09/20 1253    Clinical Impression Statement Patient concerned with new prone TE as it bothers her neck and asking for seated or standing upper back exercises. Standing Tband issued for HEP. We worked to find a stretch for the left QL region. Most effective were supine in reverse C position (hips left/shoulders and feet right) and warrior with right SB and heel raises x 10. We continued with DN/manual therapy to left paraspinals, QL and multifidi. Significant decrease in tissue tension after DN/manual. Patient demonstrated a number of stretches she was doing which all involved significant spinal flexion. We modified those we could. We also discussed her OP diagnosis and her need to get a f/u bone density test.    Personal Factors and Comorbidities Comorbidity 1;Time since onset of injury/illness/exacerbation    Comorbidities chronic pain; osteoporosis    PT Frequency 1x / week    PT Duration 12 weeks    PT Treatment/Interventions ADLs/Self Care Home Management;Cryotherapy;Electrical Stimulation;Ultrasound;Moist  Heat;Traction;Iontophoresis 4mg /ml Dexamethasone;Therapeutic activities;Therapeutic exercise;Neuromuscular re-education;Manual techniques;Patient/family education;Dry needling;Spinal Manipulations;Taping  PT Next Visit Plan assess response to DN #3 to left lumbar/QL/gluteals;  review core strengthening; add left trunk elongation to HEP (warrior/reverse C);   no spinal flexion or rotation secondary to osteoporosis.    PT Home Exercise Plan [email or web address]    Consulted and Agree with Plan of Care Patient           Patient will benefit from skilled therapeutic intervention in order to improve the following deficits and impairments:  Pain,Increased fascial restricitons,Increased muscle spasms,Decreased strength  Visit Diagnosis: Chronic left-sided low back pain without sciatica  Muscle weakness (generalized)  Muscle spasm of back     Problem List Patient Active Problem List   Diagnosis Date Noted  . 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 Biceps tendonitis on right 09/13/2019  . GERD 07/03/2007  . IBS 07/03/2007  . ALLERGIC RHINITIS 04/10/2007  . Disorder of bone and cartilage 04/10/2007    Solon Palm PT 04/09/2020, 1:09 PM  [facility] [address] [place], [place], [address] Phone: [phone]   Fax:  [phone]  Name: Rashelle Ireland MRN: [id number] Date of Birth: March 17, 1960

## 2020-04-09 NOTE — Patient Instructions (Signed)
Access Code: [email or web address] URL: [email or web address] Date: 04/09/2020 Prepared by: Raynelle Fanning  Exercises Hooklying Transversus Abdominis Palpation - 1 x daily - 7 x weekly - 3 sets - 10 reps Supine 90/90 Shoulder Flexion with Abdominal Bracing - 1 x daily - 7 x weekly - 3 sets - 10 reps Prone Hip Extension - One Pillow - 1 x daily - 7 x weekly - 1 sets - 5 reps Prone Hip Extension with Bent Knee - One Pillow - 1 x daily - 7 x weekly - 1 sets - 5 reps Prone W Scapular Retraction - 1 x daily - 7 x weekly - 1 sets - 5 reps Prone Single Arm Shoulder Abduction - 1 x daily - 7 x weekly - 1 sets - 15 reps Standing Shoulder Horizontal Abduction with Resistance - 1 x daily - 7 x weekly - 3 sets - 10 reps Standing Shoulder Diagonal Horizontal Abduction 60/120 Degrees with Resistance - 1 x daily - 7 x weekly - 3 sets - 10 reps Standing Bilateral Low Shoulder Row with Anchored Resistance - 1 x daily - 7 x weekly - 3 sets - 10 reps Shoulder Extension with Resistance - 1 x daily - 7 x weekly - 3 sets - 10 reps

## 2020-04-15 ENCOUNTER — Encounter: Payer: Federal, State, Local not specified - PPO | Admitting: Physical Therapy

## 2020-04-17 ENCOUNTER — Other Ambulatory Visit: Payer: Self-pay | Admitting: Internal Medicine

## 2020-04-17 DIAGNOSIS — M8580 Other specified disorders of bone density and structure, unspecified site: Secondary | ICD-10-CM

## 2020-04-24 ENCOUNTER — Other Ambulatory Visit: Payer: Self-pay

## 2020-04-24 ENCOUNTER — Ambulatory Visit: Payer: Federal, State, Local not specified - PPO | Attending: Family Medicine | Admitting: Physical Therapy

## 2020-04-24 DIAGNOSIS — M6281 Muscle weakness (generalized): Secondary | ICD-10-CM

## 2020-04-24 DIAGNOSIS — G8929 Other chronic pain: Secondary | ICD-10-CM

## 2020-04-24 DIAGNOSIS — M62830 Muscle spasm of back: Secondary | ICD-10-CM | POA: Insufficient documentation

## 2020-04-24 DIAGNOSIS — M545 Low back pain, unspecified: Secondary | ICD-10-CM | POA: Insufficient documentation

## 2020-04-24 NOTE — Patient Instructions (Signed)
Access Code: [email or web address] URL: [email or web address] Date: 04/24/2020 Prepared by: Lavinia Sharps  Exercises Hooklying Transversus Abdominis Palpation - 1 x daily - 7 x weekly - 3 sets - 10 reps Supine 90/90 Shoulder Flexion with Abdominal Bracing - 1 x daily - 7 x weekly - 3 sets - 10 reps Prone Hip Extension - One Pillow - 1 x daily - 7 x weekly - 1 sets - 5 reps Prone Hip Extension with Bent Knee - One Pillow - 1 x daily - 7 x weekly - 1 sets - 5 reps Prone W Scapular Retraction - 1 x daily - 7 x weekly - 1 sets - 5 reps Prone Single Arm Shoulder Abduction - 1 x daily - 7 x weekly - 1 sets - 15 reps Standing Shoulder Horizontal Abduction with Resistance - 1 x daily - 7 x weekly - 3 sets - 10 reps Standing Shoulder Diagonal Horizontal Abduction 60/120 Degrees with Resistance - 1 x daily - 7 x weekly - 3 sets - 10 reps Standing Bilateral Low Shoulder Row with Anchored Resistance - 1 x daily - 7 x weekly - 3 sets - 10 reps Shoulder Extension with Resistance - 1 x daily - 7 x weekly - 3 sets - 10 reps Sidestepping in Squat with Resistance and Arms Forward - 1 x daily - 7 x weekly - 1 sets - 10 reps Forward Monster Walks - 1 x daily - 7 x weekly - 1 sets - 10 reps Standing Clam with Resistance Loop - 1 x daily - 7 x weekly - 1 sets - 10 reps Standing Isometric Hip Abduction with Ball on Wall - 1 x daily - 7 x weekly - 1 sets - 10 reps Standing Isometric Hip Abduction with Mini Squat and Ball on Wall - 1 x daily - 7 x weekly - 1 sets - 10 reps Single Leg Deadlift with Kettlebell - 1 x daily - 7 x weekly - 1 sets - 10 reps
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
## 2020-04-30 DIAGNOSIS — R7303 Prediabetes: Secondary | ICD-10-CM | POA: Diagnosis not present

## 2020-04-30 DIAGNOSIS — M810 Age-related osteoporosis without current pathological fracture: Secondary | ICD-10-CM | POA: Diagnosis not present

## 2020-04-30 DIAGNOSIS — Z13220 Encounter for screening for lipoid disorders: Secondary | ICD-10-CM | POA: Diagnosis not present

## 2020-04-30 DIAGNOSIS — Z0000 Encounter for general adult medical examination without abnormal findings: Secondary | ICD-10-CM | POA: Diagnosis not present

## 2020-05-01 ENCOUNTER — Ambulatory Visit: Payer: Federal, State, Local not specified - PPO | Admitting: Physical Therapy

## 2020-05-01 ENCOUNTER — Other Ambulatory Visit: Payer: Self-pay

## 2020-05-01 DIAGNOSIS — M62830 Muscle spasm of back: Secondary | ICD-10-CM

## 2020-05-01 DIAGNOSIS — M545 Low back pain, unspecified: Secondary | ICD-10-CM

## 2020-05-01 DIAGNOSIS — M6281 Muscle weakness (generalized): Secondary | ICD-10-CM

## 2020-05-01 DIAGNOSIS — G8929 Other chronic pain: Secondary | ICD-10-CM | POA: Diagnosis not present
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
## 2020-05-01 NOTE — Progress Notes (Unsigned)
Joan Edwards [address] [place] [address] Phone: [phone] Subjective:   I Joan Edwards am serving as a Neurosurgeon for Dr. Antoine Edwards.  This visit occurred during the SARS-CoV-2 public health emergency.  Safety protocols were in place, including screening questions prior to the visit, additional usage of staff PPE, and extensive cleaning of exam room while observing appropriate contact time as indicated for disinfecting solutions.   I'm seeing this patient by the request  of:  Patient, No Pcp Per  CC: Shoulder and back pain follow-up  [id number]   01/15/2020 On ultrasound seems that patient is doing relatively well.  Patient does have significant decrease in hypoechoic changes that were noted previously on the bicep tendon.  Patient is making progress and do not think that any further imaging would change medical management.  Patient will be going to physical therapy for the back and if having increasing any pain in the shoulder we will start for the shoulder as well.  Follow-up again in 8 weeks  Osteoporosis.  Patient continues on the vitamin D  Continuing to have difficulty.  Patient unfortunately continues to have this pain even though she does do yoga significant amount and does have seen decent muscle imbalances.  Patient will continue to increase activity but I do think formal physical therapy will be very beneficial.  Follow-up again in 8 weeks.  Update 05/02/2020 Joan Edwards is a 60 y.o. female coming in with complaint of right bicep and low back pain. Patient states her back feels about the same. Has been going to PT for 5 sessions. Has been doing dry needling. Sees a massage therapist regularly. States her issue is deep and she is not seeing a lot of change. Still completing the exercises.The bicep is doing well. Proximal portion gets tight. She does stretches and notices that the pain will radiate down the arm at times.       X-rays of the lumbar spine were taken January 15, 2020 and were independently reviewed by me showing the patient does have facet arthritic changes noted on most of the lumbar spine especially L3-S1  Past Medical History:  Diagnosis Date  . BREAST MASS, LEFT 04/10/2007   Qualifier: Diagnosis of  By: Lovell Sheehan MD, Balinda Quails    Past Surgical History:  Procedure Laterality Date  . BREAST CYST EXCISION    . DILATION AND CURETTAGE OF UTERUS    . tonsil     Social History   Socioeconomic History  . Marital status: Married    Spouse name: Not on file  . Number of children: Not on file  . Years of education: Not on file  . Highest education level: Not on file  Occupational History  . Not on file  Tobacco Use  . Smoking status: Former Games developer  . Smokeless tobacco: Never Used  . Tobacco comment: light smoker for 15 years, quit at age 40  Vaping Use  . Vaping Use: Never used  Substance and Sexual Activity  . Alcohol use: Yes    Comment: occ, 1-2 glass of wine a few times per week  . Drug use: Never  . Sexual activity: Yes  Other Topics Concern  . 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 Erythromycin     REACTION: Upset GI  . Other     Bananas-hives  . Polymyxin B-Trimethoprim Itching  . Sulfonamide Derivatives     REACTION: itching   Family History  Problem Relation Age of Onset  . Hypertension Mother   . Hypertension Father       Current Outpatient Medications (Respiratory):   .  loratadine (CLARITIN) 10 MG tablet, Take 10 mg by mouth daily. .  pseudoephedrine (SUDAFED) 30 MG tablet, Take 30 mg by mouth every 4 (four) hours as needed for congestion.    Current Outpatient Medications (Other):  .  calcium gluconate 500 MG tablet, Take 1 tablet by mouth daily. .  cholecalciferol (VITAMIN D) 1000 UNITS tablet, Take 1,000 Units by mouth daily. .  famciclovir (FAMVIR) 250 MG tablet, TAKE ONE TABLET BY MOUTH TWICE DAILY FOR 5 DAYS AS NEEDED .  glucosamine-chondroitin 500-400 MG tablet, Take 1 tablet by mouth daily. Marland Kitchen  MAGNESIUM PO, Take by mouth. .  Multiple Vitamin (MULTIVITAMIN) capsule, Take 1 capsule by mouth daily. .  Omega-3 Fatty Acids (FISH OIL PO), Take by mouth daily. Marland Kitchen  triamcinolone (KENALOG) 0.025 % ointment, APPLY TO AFFECTED AREA TWICE A DAY .  TURMERIC PO, Take by mouth daily. .  Vitamin D, Ergocalciferol, (DRISDOL) 1.25 MG (50000 UNIT) CAPS capsule, TAKE 1 CAPSULE (50,000 UNITS TOTAL) BY MOUTH EVERY 7 (SEVEN) DAYS.   Reviewed prior external information including notes and imaging from  primary care provider As well as notes that were available from care everywhere and other healthcare systems.  Past medical history, social, surgical and family history all reviewed in electronic medical record.  No pertanent information unless stated regarding to the chief complaint.   Review of Systems:  No headache, visual changes, nausea, vomiting, diarrhea, constipation, dizziness, abdominal pain, skin rash, fevers, chills, night sweats, weight loss, swollen lymph nodes, body aches, joint swelling, chest pain, shortness of breath, mood changes. POSITIVE muscle aches  Objective  Blood pressure 124/82, pulse 65, height 5\' 2"  (1.575 m), weight 146 lb (66.2 kg), SpO2 100 %.   General: No apparent distress alert and oriented x3 mood and affect normal, dressed appropriately.  HEENT: Pupils equal, extraocular movements intact  Respiratory: Patient's speak in full  sentences and does not appear short of breath  Cardiovascular: No lower extremity edema, non tender, no erythema  Gait normal with good balance and coordination.  MSK: Right shoulder exam shows the patient has some improvement in range of motion.  Patient has negative impingement.  5 out of 5 strength of the rotator cuff.  Negative speeds Low back exam with more tenderness on the left side of the paraspinal musculature.  Patient points to an area where she feels like there is a mass.  Questionable soft tissue changes that could be more secondary to a lipoma and underneath it is seems to be more of the transverse process of the L5 vertebrae.  Limited musculoskeletal ultrasound was performed and interpreted by  Limited ultrasound of patient's right shoulder shows significant decrease in hypoechoic changes.  Patient does have  some mild arthritic changes of the glenohumeral joint and some mild degenerative changes of the supraspinatus but no true acute tear appreciated. Impression: Significant improvement in bicep tendinosis    Impression and Recommendations:     The above documentation has been reviewed and is accurate and complete Judi Saa, DO

## 2020-05-02 ENCOUNTER — Encounter: Payer: Self-pay | Admitting: Family Medicine

## 2020-05-02 ENCOUNTER — Ambulatory Visit: Payer: Federal, State, Local not specified - PPO | Admitting: Family Medicine

## 2020-05-02 ENCOUNTER — Ambulatory Visit (INDEPENDENT_AMBULATORY_CARE_PROVIDER_SITE_OTHER): Payer: Federal, State, Local not specified - PPO

## 2020-05-02 ENCOUNTER — Ambulatory Visit: Payer: Self-pay

## 2020-05-02 VITALS — BP 124/82 | HR 65 | Ht 62.0 in | Wt 146.0 lb

## 2020-05-02 DIAGNOSIS — G8929 Other chronic pain: Secondary | ICD-10-CM | POA: Diagnosis not present

## 2020-05-02 DIAGNOSIS — M533 Sacrococcygeal disorders, not elsewhere classified: Secondary | ICD-10-CM | POA: Diagnosis not present

## 2020-05-02 DIAGNOSIS — M7521 Bicipital tendinitis, right shoulder: Secondary | ICD-10-CM | POA: Diagnosis not present

## 2020-05-02 DIAGNOSIS — M542 Cervicalgia: Secondary | ICD-10-CM | POA: Diagnosis not present

## 2020-05-02 DIAGNOSIS — M25511 Pain in right shoulder: Secondary | ICD-10-CM | POA: Diagnosis not present

## 2020-05-02 NOTE — Patient Instructions (Addendum)
Good to see you Shoulder looks much better Xray on the neck Take a break from PT and do it on your own See me again in 2 months

## 2020-05-02 NOTE — Assessment & Plan Note (Signed)
Significant improvement at this time.  No significant inflammation.  Patient does though describe some mild radicular symptoms and will get x-ray of the neck to further evaluate.  Likely some mild arthritic changes but once again nothing that would change management significantly.  Follow-up again in 2 months

## 2020-05-02 NOTE — Assessment & Plan Note (Signed)
Patient is still having pain more around the left sacroiliac joint and the lower back.  Patient's x-rays do show some facet arthropathy.  Patient has done well with physical therapy but wants to discontinue it and try it on her own.  Discussed with patient about different medications such as gabapentin which patient declined.  Patient feels like she is doing well and states that the pain or discomfort comes and goes.  Patient did point to an area where she feels like there is a mass but only feels maybe a lateral left-sided lipoma and then potentially the transverse process of the L5 vertebrae.  Attempted ultrasound in the area but did not see any abnormal vascularity.  Discussed the only thing would be advanced imaging such as an MRI or CT abdomen pelvis.  Patient declined these stating that it comes and goes.  Patient also denies any fevers chills or any abnormal weight loss.  Patient will follow up again in 2 months we can discuss again

## 2020-05-08 ENCOUNTER — Encounter: Payer: Federal, State, Local not specified - PPO | Admitting: Physical Therapy

## 2020-06-25 DIAGNOSIS — D485 Neoplasm of uncertain behavior of skin: Secondary | ICD-10-CM | POA: Diagnosis not present

## 2020-07-01 NOTE — Progress Notes (Signed)
[name] [address] [place] [address] Phone: [phone] Subjective:   I Ronelle Nigh am serving as a Neurosurgeon for Dr. Antoine Primas.  This visit occurred during the SARS-CoV-2 public health emergency.  Safety protocols were in place, including screening questions prior to the visit, additional usage of staff PPE, and extensive cleaning of exam room while observing appropriate contact time as indicated for disinfecting solutions.   I'm seeing this patient by the request  of:  Georgann Housekeeper, MD  CC: Pelvic pain follow-up new onse tof hand   [id number]   05/02/2020 Significant improvement at this time.  No significant inflammation.  Patient does though describe some mild radicular symptoms and will get x-ray of the neck to further evaluate.  Likely some mild arthritic changes but once again nothing that would change management significantly.  Follow-up again in 2 months  Update 07/02/2020 Joan Edwards is a 60 y.o. female coming in with complaint of R shoulder pain and knot on L hand. Patient states her right shoulder is a lot better. States there is a knot on the palm side of her hand. Doesn't remember how it happened. Pain only with pressure on the area.   Onset- 2-2.5 weeks  Location - 3rd finger  Duration-  Character- Aggravating factors- Pressure to the area  Reliving factors-  Therapies tried- ice  Severity-     Past Medical History:  Diagnosis Date  . BREAST MASS, LEFT 04/10/2007   Qualifier: Diagnosis of  By: Lovell Sheehan MD, Balinda Quails    Past Surgical History:  Procedure Laterality Date  . BREAST CYST EXCISION    . DILATION AND CURETTAGE OF UTERUS    . tonsil     Social History   Socioeconomic History  . Marital status: Married    Spouse name: Not on file  . Number of children: Not on file  . Years of education: Not on file  . Highest education level: Not on file  Occupational History  . Not on file  Tobacco Use   . Smoking status: Former Games developer  . Smokeless tobacco: Never Used  . Tobacco comment: light smoker for 15 years, quit at age 71  Vaping Use  . Vaping Use: Never used  Substance and Sexual Activity  . Alcohol use: Yes    Comment: occ, 1-2 glass of wine a few times per week  . Drug use: Never  . Sexual activity: Yes  Other Topics Concern  . 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 Erythromycin     REACTION: Upset GI  . Other     Bananas-hives  . Polymyxin B-Trimethoprim Itching  . Sulfonamide Derivatives     REACTION: itching   Family History  Problem Relation Age of Onset  . Hypertension Mother   . Hypertension Father  Current Outpatient Medications (Respiratory):  .  loratadine (CLARITIN) 10 MG tablet, Take 10 mg by mouth daily. .  pseudoephedrine (SUDAFED) 30 MG tablet, Take 30 mg by mouth every 4 (four) hours as needed for congestion.    Current Outpatient Medications (Other):  .  calcium gluconate 500 MG tablet, Take 1 tablet by mouth daily. .  cholecalciferol (VITAMIN D) 1000 UNITS tablet, Take 1,000 Units by mouth daily. .  famciclovir (FAMVIR) 250 MG tablet, TAKE ONE TABLET BY MOUTH TWICE DAILY FOR 5 DAYS AS NEEDED .  glucosamine-chondroitin 500-400 MG tablet, Take 1 tablet by mouth daily. Marland Kitchen  MAGNESIUM PO, Take by mouth. .  Multiple Vitamin (MULTIVITAMIN) capsule, Take 1 capsule by mouth daily. .  Omega-3 Fatty Acids (FISH OIL PO), Take by mouth daily. Marland Kitchen   triamcinolone (KENALOG) 0.025 % ointment, APPLY TO AFFECTED AREA TWICE A DAY .  TURMERIC PO, Take by mouth daily. .  Vitamin D, Ergocalciferol, (DRISDOL) 1.25 MG (50000 UNIT) CAPS capsule, TAKE 1 CAPSULE (50,000 UNITS TOTAL) BY MOUTH EVERY 7 (SEVEN) DAYS.   Reviewed prior external information including notes and imaging from  primary care provider As well as notes that were available from care everywhere and other healthcare systems.  Past medical history, social, surgical and family history all reviewed in electronic medical record.  No pertanent information unless stated regarding to the chief complaint.   Review of Systems:  No headache, visual changes, nausea, vomiting, diarrhea, constipation, dizziness, abdominal pain, skin rash, fevers, chills, night sweats, weight loss, swollen lymph nodes, body aches, joint swelling, chest pain, shortness of breath, mood changes. POSITIVE muscle aches  Objective  Blood pressure 110/80, pulse 83, height 5\' 2"  (1.575 m), weight 145 lb (65.8 kg), SpO2 100 %.   General: No apparent distress alert and oriented x3 mood and affect normal, dressed appropriately.  HEENT: Pupils equal, extraocular movements intact  Respiratory: Patient's speak in full sentences and does not appear short of breath  Cardiovascular: No lower extremity edema, non tender, no erythema  Gait normal with good balance and coordination.  MSK: Right shoulder has good range of motion.  Negative speed, 5/5 strenght of rtc  Left hand exam shows the patient does have significant bruising noted on the palmar aspect of the third finger.  Minimal tenderness to palpation. No sign of infectious etiology  Good grip strength  Contralateral hand unremarkable   Limited musculoskeletal ultrasound was performed by  Limited ultrasound the patient hand shows a subcutaneous cyst, fluid filled, no abnormal vascularity or soft tissue swelling, does not communicate with tendon Impressio:  Simple cyst    Impression and Recommendations:     The above documentation has been reviewed and is accurate and complete Judi Saa, DO

## 2020-07-02 ENCOUNTER — Ambulatory Visit: Payer: Federal, State, Local not specified - PPO | Admitting: Family Medicine

## 2020-07-02 ENCOUNTER — Other Ambulatory Visit: Payer: Self-pay

## 2020-07-02 ENCOUNTER — Encounter: Payer: Self-pay | Admitting: Family Medicine

## 2020-07-02 ENCOUNTER — Ambulatory Visit: Payer: Self-pay

## 2020-07-02 VITALS — BP 110/80 | HR 83 | Ht 62.0 in | Wt 145.0 lb

## 2020-07-02 DIAGNOSIS — M71342 Other bursal cyst, left hand: Secondary | ICD-10-CM

## 2020-07-02 DIAGNOSIS — M79642 Pain in left hand: Secondary | ICD-10-CM | POA: Diagnosis not present

## 2020-07-02 DIAGNOSIS — M7521 Bicipital tendinitis, right shoulder: Secondary | ICD-10-CM

## 2020-07-02 NOTE — Assessment & Plan Note (Signed)
Significant improvement at this time.  No change in management.

## 2020-07-02 NOTE — Patient Instructions (Addendum)
Wart remover cream for one week to soften skin  No abnormal blood flow so let's watch it Shoulder looks great Call PT to see if they will do dry needling Call us if you need a referral  See [place] as needfed

## 2020-07-02 NOTE — Assessment & Plan Note (Signed)
Likely a blood glucose on the home is less than the blood.  The third finger.  Patient has some mild discoloration.  On ultrasound no foreign body noted.  No abnormal blood flow.  Discussed with patient about multiple things. Discoloration or pain in CICU.  Otherwise nothing significant.  Follow-up in 6 weeks

## 2020-09-26 ENCOUNTER — Other Ambulatory Visit: Payer: Self-pay | Admitting: Internal Medicine

## 2020-09-26 DIAGNOSIS — Z1231 Encounter for screening mammogram for malignant neoplasm of breast: Secondary | ICD-10-CM

## 2020-11-20 ENCOUNTER — Other Ambulatory Visit: Payer: Self-pay

## 2020-11-20 ENCOUNTER — Ambulatory Visit
Admission: RE | Admit: 2020-11-20 | Discharge: 2020-11-20 | Disposition: A | Discharge status: Home / Self Care | Payer: Federal, State, Local not specified - PPO | Source: Ambulatory Visit | Attending: Internal Medicine | Admitting: Internal Medicine

## 2020-11-20 DIAGNOSIS — Z1231 Encounter for screening mammogram for malignant neoplasm of breast: Secondary | ICD-10-CM

## 2021-05-20 ENCOUNTER — Other Ambulatory Visit: Payer: Self-pay | Admitting: Internal Medicine

## 2021-05-20 DIAGNOSIS — M810 Age-related osteoporosis without current pathological fracture: Secondary | ICD-10-CM

## 2021-06-01 LAB — TSH: TSH: 4.36 (ref 0.41–5.90)

## 2021-10-16 ENCOUNTER — Other Ambulatory Visit: Payer: Self-pay | Admitting: Internal Medicine

## 2021-10-16 DIAGNOSIS — Z1231 Encounter for screening mammogram for malignant neoplasm of breast: Secondary | ICD-10-CM

## 2021-10-30 ENCOUNTER — Ambulatory Visit
Admission: RE | Admit: 2021-10-30 | Discharge: 2021-10-30 | Disposition: A | Discharge status: Home / Self Care | Payer: Federal, State, Local not specified - PPO | Source: Ambulatory Visit | Attending: Internal Medicine | Admitting: Internal Medicine

## 2021-10-30 DIAGNOSIS — M810 Age-related osteoporosis without current pathological fracture: Secondary | ICD-10-CM

## 2021-11-23 ENCOUNTER — Ambulatory Visit: Payer: Federal, State, Local not specified - PPO

## 2021-12-03 ENCOUNTER — Ambulatory Visit
Admission: RE | Admit: 2021-12-03 | Discharge: 2021-12-03 | Disposition: A | Discharge status: Home / Self Care | Payer: Federal, State, Local not specified - PPO | Source: Ambulatory Visit | Attending: Internal Medicine | Admitting: Internal Medicine

## 2021-12-03 DIAGNOSIS — Z1231 Encounter for screening mammogram for malignant neoplasm of breast: Secondary | ICD-10-CM

## 2022-01-01 NOTE — Progress Notes (Unsigned)
[name] [address] [place] [address] Phone: [phone] Subjective:   INadine Counts, am serving as a scribe for Dr. Antoine Primas.  I'm seeing this patient by the request  of:  Georgann Housekeeper, MD  CC: Low back pain and left SI joint  EGB:[id number]  Last seen April 2022  Anya Murphey Oglesby is a 61 y.o. female coming in with complaint of LBP. Having left SI joint pain. Not having any radiating pain. Same pain as she had in previous years. Dry needling and PT worked out well and she would like to go down that avenue again if possible.        Past Medical History:  Diagnosis Date   BREAST MASS, LEFT 04/10/2007   Qualifier: Diagnosis of  By: Lovell Sheehan MD, Balinda Quails    Past Surgical History:  Procedure Laterality Date   BREAST CYST EXCISION     DILATION AND CURETTAGE OF UTERUS     tonsil     Social History   Socioeconomic History   Marital status: Married    Spouse name: Not on file   Number of children: Not on file   Years of education: Not on file   Highest education level: Not on file  Occupational History   Not on file  Tobacco Use   Smoking status: Former   Smokeless tobacco: Never   Tobacco comments:    light smoker for 15 years, quit at age 47  Vaping Use   Vaping Use: Never used  Substance and Sexual Activity   Alcohol use: Yes    Comment: occ, 1-2 glass of wine a few times per week   Drug use: Never   Sexual activity: Yes  Other Topics Concern   Not on file  Social History Narrative   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      Home Situation: lives with husband and daughter, son      Spiritual Beliefs: Christian      Lifestyle: yoga once per week - more in the summer, walks a few days per week; diet is fair      Social Determinants of Corporate investment banker Strain: Not on file  Food Insecurity: Not on file  Transportation Needs: Not on file  Physical Activity: Not on file  Stress: Not  on file  Social Connections: Not on file   Allergies  Allergen Reactions   Erythromycin     REACTION: Upset GI   Other     Bananas-hives   Polymyxin B-Trimethoprim Itching   Sulfonamide Derivatives     REACTION: itching   Family History  Problem Relation Age of Onset   Hypertension Mother    Hypertension Father       Current Outpatient Medications (Respiratory):    loratadine (CLARITIN) 10 MG tablet, Take 10 mg by mouth daily.   pseudoephedrine (SUDAFED) 30 MG tablet, Take 30 mg by mouth every 4 (four) hours as needed for congestion.    Current Outpatient Medications (Other):    calcium gluconate 500 MG tablet, Take 1 tablet by mouth daily.   cholecalciferol (VITAMIN D) 1000 UNITS tablet, Take 1,000 Units by mouth daily.   famciclovir (FAMVIR) 250 MG tablet, TAKE ONE TABLET BY MOUTH TWICE DAILY FOR 5 DAYS AS NEEDED   glucosamine-chondroitin 500-400 MG tablet, Take 1 tablet by mouth daily.   MAGNESIUM PO, Take by mouth.   Multiple Vitamin (MULTIVITAMIN) capsule, Take 1 capsule by mouth daily.  Omega-3 Fatty Acids (FISH OIL PO), Take by mouth daily.   triamcinolone (KENALOG) 0.025 % ointment, APPLY TO AFFECTED AREA TWICE A DAY   TURMERIC PO, Take by mouth daily.   Vitamin D, Ergocalciferol, (DRISDOL) 1.25 MG (50000 UNIT) CAPS capsule, TAKE 1 CAPSULE (50,000 UNITS TOTAL) BY MOUTH EVERY 7 (SEVEN) DAYS.   Reviewed prior external information including notes and imaging from  primary care provider As well as notes that were available from care everywhere and other healthcare systems.  Past medical history, social, surgical and family history all reviewed in electronic medical record.  No pertanent information unless stated regarding to the chief complaint.   Review of Systems:  No headache, visual changes, nausea, vomiting, diarrhea, constipation, dizziness, abdominal pain, skin rash, fevers, chills, night sweats, weight loss, swollen lymph nodes, body aches, joint swelling,  chest pain, shortness of breath, mood changes. POSITIVE muscle aches  Objective  Blood pressure 132/76, pulse 69, height 5\' 2"  (1.575 m), weight 149 lb (67.6 kg), SpO2 98 %.   General: No apparent distress alert and oriented x3 mood and affect normal, dressed appropriately.  HEENT: Pupils equal, extraocular movements intact  Respiratory: Patient's speak in full sentences and does not appear short of breath  Cardiovascular: No lower extremity edema, non tender, no erythema  Patient does have some increase in hypermobility noted.  Patient though does have tenderness to palpation in the piriformis.  Does have some worsening pain with extension of the back.  Patient does have some tenderness also over the sacroiliac joint. Mild increase in radicular symptoms from previous exam.     Impression and Recommendations:      The above documentation has been reviewed and is accurate and complete Lyndal Pulley, DO

## 2022-01-06 ENCOUNTER — Ambulatory Visit: Payer: Federal, State, Local not specified - PPO | Admitting: Family Medicine

## 2022-01-06 VITALS — BP 132/76 | HR 69 | Ht 62.0 in | Wt 149.0 lb

## 2022-01-06 DIAGNOSIS — M7918 Myalgia, other site: Secondary | ICD-10-CM

## 2022-01-06 DIAGNOSIS — M533 Sacrococcygeal disorders, not elsewhere classified: Secondary | ICD-10-CM

## 2022-01-06 MED ORDER — GABAPENTIN 100 MG PO CAPS: 200.0000 mg | ORAL_CAPSULE | Freq: Every day | ORAL | 0 refills | Status: AC

## 2022-01-06 NOTE — Assessment & Plan Note (Signed)
Patient does have sacroiliac joint dysfunction but also has what appears to be more piriformis.  Some mild radicular symptoms are well started on gabapentin.  Chronic problem with exacerbation noted.  Discussed icing regimen and home exercises.  We will start formal physical therapy which patient did respond to extremely well previously.  Follow-up with me again in 6 to 8 weeks

## 2022-01-06 NOTE — Patient Instructions (Addendum)
PT referral Gabapentin 200mg  nightly prescribed Okay to stay active See you again in 6-8 weeks

## 2022-01-07 NOTE — Therapy (Signed)
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   Biceps tendonitis on right 09/13/2019   GERD 07/03/2007   IBS 07/03/2007   ALLERGIC RHINITIS 04/10/2007   Disorder of bone and cartilage 04/10/2007    PCP: Wenda Low, MD  REFERRING PROVIDER: Lyndal Pulley, DO  REFERRING DIAG: SI (sacroiliac) joint dysfunction [M53.3], Piriformis muscle pain [M79.18]  THERAPY DIAG:  Chronic left-sided low back pain without sciatica  Muscle weakness (generalized)  Muscle spasm of back  Other low back pain  Rationale for Evaluation and Treatment: Rehabilitation  ONSET DATE: Years   SUBJECTIVE:   SUBJECTIVE STATEMENT: Pt reports to PT with an acute on chronic piriforms and SIJ pain. She states that she has been dealing with this for years, but had a flare up about 2 months ago. Pt states that she had PT in the past and felt that dry needling was very beneficial. She also reports getting a deep tissue massage twice a month which has also really helped, along with yoga.   PERTINENT HISTORY: Osteopetrosis.  PAIN:  Are you having pain? Yes: NPRS scale: 4-5/10 Pain location: L side glute, lower back and hip region.  Pain description: Nerve pain, achy.   Aggravating factors: Sitting Relieving factors: Walking, heat    PRECAUTIONS: None  WEIGHT BEARING RESTRICTIONS: No  FALLS:  Has patient fallen in last 6 months? 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   PLOF: Independent  PATIENT GOALS: Pt would like to strengthen up her lower trunk and bilat LE's and reduce pain.    OBJECTIVE:   DIAGNOSTIC FINDINGS: None   PATIENT SURVEYS:  FOTO 69.01%, 71% in 10 visits.   COGNITION: Overall cognitive status: Within functional limits for tasks assessed     SENSATION: WFL   POSTURE: No Significant postural limitations  PALPATION: Multifidi, QL  LOWER EXTREMITY ROM:  Active ROM Right eval Left eval  Hip flexion [facility]  Hip internal rotation [facility]  Hip external rotation [facility]  Knee flexion [facility] WFL  Knee extension WFL WFL   (Blank rows = not tested)  LOWER EXTREMITY MMT:  MMT Right eval Left eval  Hip flexion 4+ 4+  Knee flexion 4+ 4-  Knee extension 4+ 4-   (Blank rows = not tested)  LOWER EXTREMITY SPECIAL TESTS:  Hip special tests: SI compression test: negative and SI distraction test: negative   GAIT: Distance walked: 67ft  Assistive device utilized: None Level of assistance: Complete Independence Comments: No antalgic gait patterns noted.    TODAY'S TREATMENT:  DATE: Reviewed what pt is currently doing at home for HEP.      PATIENT EDUCATION:  Education details: Educated pt on anatomy and physiology of current symptoms, FOTO, diagnosis, prognosis, HEP,  and POC. Person educated: Patient Education method: Customer service manager Education comprehension: verbalized understanding and returned demonstration  HOME EXERCISE PROGRAM: None today due to time constraint.    ASSESSMENT:  CLINICAL IMPRESSION: Patient referred to PT for SIJ and piriformis pain. Pt with  no tenderness to palpation, and demonstrates full functional ROM. She is lacking strength in bilat LE's L>R and core strength. Patient will benefit from skilled PT to address below impairments, limitations and improve overall function.  OBJECTIVE IMPAIRMENTS: decreased activity tolerance, difficulty walking, decreased balance, decreased endurance, decreased mobility, decreased ROM, decreased strength, impaired flexibility, impaired UE/LE use, postural dysfunction, and pain.  ACTIVITY LIMITATIONS: bending, lifting, carry, locomotion, cleaning, community activity, driving, and or occupation  PERSONAL FACTORS: Osteoperosis are also affecting patient's functional outcome.  REHAB POTENTIAL: Good  CLINICAL DECISION MAKING: Stable/uncomplicated  EVALUATION COMPLEXITY: Low    GOALS: Short term PT Goals Target date: 02/05/2022 Pt will be I and compliant with HEP. Baseline:  Goal status: New Pt will decrease pain by 25% overall Baseline: Goal status: New  Long term PT goals Target date: 03/05/2022 Pt will improve ROM to [facility] to improve functional mobility Baseline: Goal status: New Pt will improve  hip/knee strength to at least 5-/5 MMT to improve functional strength Baseline: Goal status: New Pt will improve FOTO to at least 71% functional to show improved function Baseline: Goal status: New Pt will reduce pain by overall 50% overall with usual activity Baseline: Goal status: New Pt will reduce pain to overall less than 2-3/10 with usual activity and work activity. Baseline: Goal status: New Pt will be able to sit for prolonged periods of time without complaints Baseline: Goal status: New  PLAN: PT FREQUENCY: 1-3 times per week   PT DURATION: 6-8 weeks  PLANNED INTERVENTIONS (unless contraindicated): aquatic PT, Canalith repositioning, cryotherapy, Electrical stimulation, Iontophoresis with 4 mg/ml dexamethasome, Moist heat, traction, Ultrasound, gait training, Therapeutic  exercise, balance training, neuromuscular re-education, patient/family education, prosthetic training, manual techniques, passive ROM, dry needling, taping, vasopnuematic device, vestibular, spinal manipulations, joint manipulations  PLAN FOR NEXT SESSION: Assess HEP/update PRN,  strengthen glute and core muscles. Decrease patients pain and help minimize functional deficits.     Lynden Ang, PT 01/08/2022, 9:22 AM

## 2022-01-08 ENCOUNTER — Other Ambulatory Visit: Payer: Self-pay

## 2022-01-08 ENCOUNTER — Encounter: Payer: Self-pay | Admitting: Physical Therapy

## 2022-01-08 ENCOUNTER — Ambulatory Visit: Payer: Federal, State, Local not specified - PPO | Attending: Family Medicine | Admitting: Physical Therapy

## 2022-01-08 DIAGNOSIS — M6281 Muscle weakness (generalized): Secondary | ICD-10-CM | POA: Insufficient documentation

## 2022-01-08 DIAGNOSIS — M5459 Other low back pain: Secondary | ICD-10-CM | POA: Insufficient documentation

## 2022-01-08 DIAGNOSIS — M62830 Muscle spasm of back: Secondary | ICD-10-CM | POA: Diagnosis not present

## 2022-01-08 DIAGNOSIS — M533 Sacrococcygeal disorders, not elsewhere classified: Secondary | ICD-10-CM | POA: Insufficient documentation

## 2022-01-08 DIAGNOSIS — G8929 Other chronic pain: Secondary | ICD-10-CM

## 2022-01-19 NOTE — Therapy (Unsigned)
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  Rationale for Evaluation and Treatment: Rehabilitation  ONSET DATE: Years   SUBJECTIVE:   SUBJECTIVE STATEMENT: Pt reports to PT with an acute on chronic piriforms and SIJ pain. She states that she has been dealing with this for years, but had a flare up about 2 months ago. Pt states that she had PT in the past and felt that dry needling was very beneficial. She also reports getting a deep tissue massage twice a month which has also really helped, along with yoga.   PERTINENT HISTORY: Osteopetrosis.  PAIN:  Are you having pain? Yes: NPRS scale: 4-5/10 Pain location: L side glute, lower back and hip region.  Pain description: Nerve pain, achy.   Aggravating factors: Sitting Relieving factors: Walking, heat    PRECAUTIONS: None  WEIGHT BEARING RESTRICTIONS: No  FALLS:  Has patient fallen in last 6 months? 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   PLOF: Independent  PATIENT GOALS: Pt would like to strengthen up her lower trunk and bilat LE's and reduce pain.    OBJECTIVE:   DIAGNOSTIC FINDINGS: None   PATIENT SURVEYS:  FOTO 69.01%, 71% in 10 visits.   COGNITION: Overall cognitive status: Within functional limits for tasks assessed     SENSATION: WFL   POSTURE: No Significant postural limitations  PALPATION: Multifidi, QL  LOWER EXTREMITY ROM:  Active ROM Right eval Left eval  Hip flexion [facility]  Hip internal rotation [facility]  Hip external rotation [facility]  Knee flexion [facility] WFL  Knee extension WFL WFL   (Blank rows = not tested)  LOWER EXTREMITY MMT:  MMT Right eval Left eval  Hip flexion 4+ 4+  Knee flexion 4+ 4-  Knee extension 4+ 4-   (Blank rows = not tested)  LOWER EXTREMITY SPECIAL TESTS:  Hip special tests: SI compression test: negative and SI distraction test: negative   GAIT: Distance walked: 71ft  Assistive device utilized: None Level of assistance: Complete Independence Comments: No antalgic gait patterns noted.    TODAY'S TREATMENT:  DATE: Reviewed what pt is currently doing at home for HEP.      PATIENT EDUCATION:  Education details: Educated pt on anatomy and physiology of current symptoms, FOTO, diagnosis, prognosis, HEP,  and POC. Person educated: Patient Education method: Customer service manager Education comprehension: verbalized understanding and returned demonstration  HOME EXERCISE PROGRAM: None today due to time constraint.    ASSESSMENT:  CLINICAL IMPRESSION: Patient referred to PT for SIJ and piriformis pain. Pt with no tenderness to palpation, and demonstrates full functional ROM. She is lacking strength in bilat LE's L>R  and core strength. Patient will benefit from skilled PT to address below impairments, limitations and improve overall function.  OBJECTIVE IMPAIRMENTS: decreased activity tolerance, difficulty walking, decreased balance, decreased endurance, decreased mobility, decreased ROM, decreased strength, impaired flexibility, impaired UE/LE use, postural dysfunction, and pain.  ACTIVITY LIMITATIONS: bending, lifting, carry, locomotion, cleaning, community activity, driving, and or occupation  PERSONAL FACTORS: Osteoperosis are also affecting patient's functional outcome.  REHAB POTENTIAL: Good  CLINICAL DECISION MAKING: Stable/uncomplicated  EVALUATION COMPLEXITY: Low    GOALS: Short term PT Goals Target date: 02/16/2022 Pt will be I and compliant with HEP. Baseline:  Goal status: New Pt will decrease pain by 25% overall Baseline: Goal status: New  Long term PT goals Target date: 03/16/2022 Pt will improve ROM to [facility] to improve functional mobility Baseline: Goal status: New Pt will improve  hip/knee strength to at least 5-/5 MMT to improve functional strength Baseline: Goal status: New Pt will improve FOTO to at least 71% functional to show improved function Baseline: Goal status: New Pt will reduce pain by overall 50% overall with usual activity Baseline: Goal status: New Pt will reduce pain to overall less than 2-3/10 with usual activity and work activity. Baseline: Goal status: New Pt will be able to sit for prolonged periods of time without complaints Baseline: Goal status: New  PLAN: PT FREQUENCY: 1-3 times per week   PT DURATION: 6-8 weeks  PLANNED INTERVENTIONS (unless contraindicated): aquatic PT, Canalith repositioning, cryotherapy, Electrical stimulation, Iontophoresis with 4 mg/ml dexamethasome, Moist heat, traction, Ultrasound, gait training, Therapeutic exercise, balance training, neuromuscular re-education, patient/family education, prosthetic training, manual  techniques, passive ROM, dry needling, taping, vasopnuematic device, vestibular, spinal manipulations, joint manipulations  PLAN FOR NEXT SESSION: Assess HEP/update PRN,  strengthen glute and core muscles. Decrease patients pain and help minimize functional deficits.     Lynden Ang, PT 01/19/2022, 2:34 PM

## 2022-01-21 ENCOUNTER — Ambulatory Visit: Payer: Federal, State, Local not specified - PPO | Attending: Family Medicine | Admitting: Physical Therapy

## 2022-01-21 ENCOUNTER — Encounter: Payer: Self-pay | Admitting: Physical Therapy

## 2022-01-21 DIAGNOSIS — G8929 Other chronic pain: Secondary | ICD-10-CM | POA: Diagnosis present

## 2022-01-21 DIAGNOSIS — M62830 Muscle spasm of back: Secondary | ICD-10-CM | POA: Insufficient documentation

## 2022-01-21 DIAGNOSIS — M5459 Other low back pain: Secondary | ICD-10-CM | POA: Insufficient documentation

## 2022-01-21 DIAGNOSIS — M6281 Muscle weakness (generalized): Secondary | ICD-10-CM | POA: Diagnosis present

## 2022-01-21 DIAGNOSIS — M545 Low back pain, unspecified: Secondary | ICD-10-CM | POA: Diagnosis not present

## 2022-01-21 NOTE — Therapy (Signed)
OUTPATIENT PHYSICAL THERAPY TREATMENT NOTE   Patient Name: Joan Edwards MRN: [id number] DOB:[birth year], 61 y.o., female Today's Date: 01/21/2022  PCP: Georgann Housekeeper, MD  REFERRING PROVIDER: Judi Saa, DO   END OF SESSION:   PT End of Session - 01/21/22 1014     Visit Number 2    Number of Visits 16    Date for PT Re-Evaluation 03/05/22    Authorization Type [employer] PPO    PT Start Time 0930    PT Stop Time 1010    PT Time Calculation (min) 40 min    Activity Tolerance Patient tolerated treatment well    Behavior During Therapy [facility] for tasks assessed/performed             Past Medical History:  Diagnosis Date   BREAST MASS, LEFT 04/10/2007   Qualifier: Diagnosis of  By: Lovell Sheehan MD, Balinda Quails    Past Surgical History:  Procedure Laterality Date   BREAST CYST EXCISION     DILATION AND CURETTAGE OF UTERUS     tonsil     Patient Active Problem List   Diagnosis Date Noted   Other bursal cyst, left hand 07/02/2020   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    PRECAUTIONS: None   SUBJECTIVE:                                                                                                                                                                                      SUBJECTIVE STATEMENT:   Pt reports to PT with continued pain in her glute. She states that she got a massage and did yoga twice this week which was very beneficial.   Eval: Pt reports to PT  with an acute on chronic piriforms and SIJ pain. She states that she has been dealing with this for years, but had a flare up about 2 months ago. Pt states that she had PT in the past and felt that dry needling was very beneficial. She also reports getting a deep tissue massage twice a month which has also really helped, along with yoga.     PAIN:  Are you having pain? Yes: NPRS scale: 4-5/10 Pain location: L  side glute, lower back and hip region.  Pain description: Nerve pain, achy.   Aggravating factors: Sitting Relieving factors: Walking, heat     OBJECTIVE:    DIAGNOSTIC FINDINGS: None    PATIENT SURVEYS:  FOTO 69.01%, 71% in 10 visits.    COGNITION: Overall cognitive status: Within functional limits for tasks assessed                                    SENSATION: WFL     POSTURE: No Significant postural limitations   PALPATION: Multifidi, QL   LOWER EXTREMITY ROM:   Active ROM Right eval Left eval  Hip flexion [facility]  Hip internal rotation [facility]  Hip external rotation [facility]  Knee flexion [facility] WFL  Knee extension WFL WFL   (Blank rows = not tested)   LOWER EXTREMITY MMT:   MMT Right eval Left eval  Hip flexion 4+ 4+  Knee flexion 4+ 4-  Knee extension 4+ 4-   (Blank rows = not tested)   LOWER EXTREMITY SPECIAL TESTS:  Hip special tests: SI compression test: negative and SI distraction test: negative     GAIT: Distance walked: 7ft  Assistive device utilized: None Level of assistance: Complete Independence Comments: No antalgic gait patterns noted.      TODAY'S TREATMENT:  01/21/2022: Nustep level 3, 5 min Piriformis 2x 30 sec  LTR x 30  SKTC on L LE only 2 x30 sec  Modified hip flexor stretch R LE only 2x30 sec  Trigger Point Dry-Needling  Treatment instructions: Expect mild to moderate muscle soreness. S/S of pneumothorax if dry needled over a lung field, and to seek immediate medical attention should they occur. Patient verbalized  understanding of these instructions and education.  Patient Consent Given: Yes Education handout provided: No Muscles treated: L L3 multifidi (utilizing bracket technique)  Electrical stimulation performed: No Parameters: N/A Treatment response/outcome: Pt reports some relief, no adverse effects noted.                                                                                                                                DATE: Reviewed what pt is currently doing at home for HEP.        PATIENT EDUCATION:  Education details: Educated pt on anatomy and physiology of current symptoms, FOTO, diagnosis, prognosis, HEP,  and POC. Person educated: Patient Education method: Medical illustrator Education comprehension: verbalized understanding and returned demonstration   HOME EXERCISE PROGRAM: None today due to time constraint.      ASSESSMENT:   CLINICAL IMPRESSION: Patient presents to first follow up appt with continued pain in her L lower back, SI area. She states that she has been active with stretches, yoga and massage. Session with focus on stretches targeting bilat hips and lower back. Followed up with TPDN to L multifidi due to  significant TrP. She tolerated needling well, with achiness reported during, but no adverse effects. STM to area with a decrease in tension noted. Encouraged continued stretching at home. Plan to incorporate glute and core exercises next session. Add E-stim to dry needling. Pt will continue to benefit from skilled PT to address continued deficits.    OBJECTIVE IMPAIRMENTS: decreased activity tolerance, difficulty walking, decreased balance, decreased endurance, decreased mobility, decreased ROM, decreased strength, impaired flexibility, impaired UE/LE use, postural dysfunction, and pain.   ACTIVITY LIMITATIONS: bending, lifting, carry, locomotion, cleaning, community activity, driving, and or occupation   PERSONAL FACTORS: Osteoperosis are also  affecting patient's functional outcome.   REHAB POTENTIAL: Good   CLINICAL DECISION MAKING: Stable/uncomplicated   EVALUATION COMPLEXITY: Low       GOALS: Short term PT Goals Target date: 02/05/2022 Pt will be I and compliant with HEP. Baseline:  Goal status: New Pt will decrease pain by 25% overall Baseline: Goal status: New   Long term PT goals Target date: 03/05/2022 Pt will improve ROM to [facility] to improve functional mobility Baseline: Goal status: New Pt will improve  hip/knee strength to at least 5-/5 MMT to improve functional strength Baseline: Goal status: New Pt will improve FOTO to at least 71% functional to show improved function Baseline: Goal status: New Pt will reduce pain by overall 50% overall with usual activity Baseline: Goal status: New Pt will reduce pain to overall less than 2-3/10 with usual activity and work activity. Baseline: Goal status: New Pt will be able to sit for prolonged periods of time without complaints Baseline: Goal status: New   PLAN: PT FREQUENCY: 1-3 times per week    PT DURATION: 6-8 weeks   PLANNED INTERVENTIONS (unless contraindicated): aquatic PT, Canalith repositioning, cryotherapy, Electrical stimulation, Iontophoresis with 4 mg/ml dexamethasome, Moist heat, traction, Ultrasound, gait training, Therapeutic exercise, balance training, neuromuscular re-education, patient/family education, prosthetic training, manual techniques, passive ROM, dry needling, taping, vasopnuematic device, vestibular, spinal manipulations, joint manipulations   PLAN FOR NEXT SESSION: Assess HEP/update PRN,  strengthen glute and core muscles. Decrease patients pain and help minimize functional deficits.   Champ Mungo, PT 01/21/2022, 10:15 AM

## 2022-01-26 ENCOUNTER — Ambulatory Visit: Payer: Federal, State, Local not specified - PPO | Admitting: Physical Therapy

## 2022-01-26 DIAGNOSIS — M62830 Muscle spasm of back: Secondary | ICD-10-CM

## 2022-01-26 DIAGNOSIS — M545 Low back pain, unspecified: Secondary | ICD-10-CM | POA: Diagnosis not present

## 2022-01-26 DIAGNOSIS — M6281 Muscle weakness (generalized): Secondary | ICD-10-CM

## 2022-01-26 NOTE — Therapy (Signed)
OUTPATIENT PHYSICAL THERAPY TREATMENT NOTE   Patient Name: Joan Edwards MRN: [id number] DOB:17-Jul-1960, 61 y.o., female Today's Date: 01/26/2022  PCP: Georgann Housekeeper, MD  REFERRING PROVIDER: Judi Saa, DO   END OF SESSION:   PT End of Session - 01/26/22 1451     Visit Number 3    Number of Visits 16    Date for PT Re-Evaluation 03/05/22    Authorization Type BC[employer] PPO    PT Start Time 1446    PT Stop Time 1529    PT Time Calculation (min) 43 min    Activity Tolerance Patient tolerated treatment well             Past Medical History:  Diagnosis Date   BREAST MASS, LEFT 04/10/2007   Qualifier: Diagnosis of  By: Lovell Sheehan MD, Balinda Quails    Past Surgical History:  Procedure Laterality Date   BREAST CYST EXCISION     DILATION AND CURETTAGE OF UTERUS     tonsil     Patient Active Problem List   Diagnosis Date Noted   Other bursal cyst, left hand 07/02/2020   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    PRECAUTIONS: None   SUBJECTIVE:                                                                                                                                                                                      SUBJECTIVE STATEMENT:   Just had yoga class.  No pain walking/moving. Pain with riding in car.  Some AM pain today.    Eval: Pt reports to PT with an acute on chronic piriforms and SIJ pain. She states that she has been dealing with this for years, but had a flare up about 2  months ago. Pt states that she had PT in the past and felt that dry needling was very beneficial. She also reports getting a deep tissue massage twice a month which has also really helped, along with yoga.     PAIN:  Are you having pain? Yes: NPRS scale: 2-3/10 Pain location: L side glute, lower back and hip region.; left hip flexor tightness Pain description: Nerve pain, achy.   Aggravating factors: Sitting Relieving factors:  Walking, heat     OBJECTIVE:    DIAGNOSTIC FINDINGS: None    PATIENT SURVEYS:  FOTO 69.01%, 71% in 10 visits.    COGNITION: Overall cognitive status: Within functional limits for tasks assessed                                    SENSATION: WFL     POSTURE: No Significant postural limitations   PALPATION: Multifidi, QL   LOWER EXTREMITY ROM:   Active ROM Right eval Left eval  Hip flexion [facility]  Hip internal rotation [facility]  Hip external rotation [facility]  Knee flexion [facility] WFL  Knee extension WFL WFL   (Blank rows = not tested)   LOWER EXTREMITY MMT:   MMT Right eval Left eval  Hip flexion 4+ 4+  Knee flexion 4+ 4-  Knee extension 4+ 4-   (Blank rows = not tested)   LOWER EXTREMITY SPECIAL TESTS:  Hip special tests: SI compression test: negative and SI distraction test: negative     GAIT: Distance walked: 14ft  Assistive device utilized: None Level of assistance: Complete Independence Comments: No antalgic gait patterns noted.      TODAY'S TREATMENT:  11/14: Nu-Step 9 min L3 while discussing status Bird dogs Prone over 2 pillows with hip extension in yoga class Primal hover 5 sec holds Pt states modified planks on elbows aggravates her neck but hover planks (arms straight) are not painful Manual therapy: soft tissue mobilization to left QL and left gluteals, piriformis Trigger Point Dry-Needling  Treatment instructions: Expect mild to moderate muscle soreness. S/S of pneumothorax if dry needled over a lung field, and to  seek immediate medical attention should they occur. Patient verbalized understanding of these instructions and education.  Patient Consent Given: Yes Education handout provided: previously given Muscles treated: bil lumbar multifidi; left QL, left gluteals/piriformis Electrical stimulation performed: yes Parameters: 80 pps 8 min to bil multifidi Treatment response/outcome: improved soft tissue mobility     01/21/2022: Nustep level 3, 5 min Piriformis 2x 30 sec  LTR x 30  SKTC on L LE only 2 x30 sec  Modified hip flexor stretch R LE only 2x30 sec  Trigger Point Dry-Needling  Treatment instructions: Expect mild to moderate muscle soreness. S/S of pneumothorax if dry needled over a lung field, and to seek immediate medical attention should they occur. Patient verbalized understanding of these instructions and education.  Patient Consent Given: Yes Education handout provided: No Muscles treated: L L3 multifidi (utilizing bracket technique)  Electrical stimulation performed: No Parameters: N/A Treatment response/outcome: Pt reports some relief, no adverse effects noted.                                                                                                                                DATE: Reviewed what pt is currently doing at home for  HEP.        PATIENT EDUCATION:  Education details: Educated pt on anatomy and physiology of current symptoms, FOTO, diagnosis, prognosis, HEP,  and POC. Person educated: Patient Education method: Medical illustrator Education comprehension: verbalized understanding and returned demonstration   HOME EXERCISE PROGRAM: Access Code: [email or web address] URL: [email or web address] Date: 01/26/2022 Prepared by: Lavinia Sharps  Exercises - Bird Dog  - 1 x daily - 7 x weekly - 1 sets - 10 reps - Primal Push Up  - 1 x daily - 7 x weekly - 1 sets - 10 reps - Prone Hip Extension - Two Pillows  - 1 x daily - 7 x weekly - 1 sets - 10  reps      ASSESSMENT:   CLINICAL IMPRESSION: The patient was encouraged in regular performance of HEP post DN including soft tissue lengthening and strengthening exercises to enhance long term benefit.  Good initial response to added ES to DN for greater stimulation to deep multifidi muscles.  Discussed modifications including a pillow under hips in prone and primal push/hover instead of planks which bother neck.  Therapist monitoring response to all interventions and modifying treatment accordingly.      OBJECTIVE IMPAIRMENTS: decreased activity tolerance, difficulty walking, decreased balance, decreased endurance, decreased mobility, decreased ROM, decreased strength, impaired flexibility, impaired UE/LE use, postural dysfunction, and pain.   ACTIVITY LIMITATIONS: bending, lifting, carry, locomotion, cleaning, community activity, driving, and or occupation   PERSONAL FACTORS: Osteoperosis are also affecting patient's functional outcome.   REHAB POTENTIAL: Good   CLINICAL DECISION MAKING: Stable/uncomplicated   EVALUATION COMPLEXITY: Low       GOALS: Short term PT Goals Target date: 02/05/2022 Pt will be I and compliant with HEP. Baseline:  Goal status: New Pt will decrease pain by 25% overall Baseline: Goal status: New   Long term PT goals Target date: 03/05/2022 Pt will improve ROM to [facility] to improve functional mobility Baseline: Goal status: New Pt will improve  hip/knee strength to at least 5-/5 MMT to improve functional strength Baseline: Goal status: New Pt will improve FOTO to at least 71% functional to show improved function Baseline: Goal status: New Pt will reduce pain by overall 50% overall with usual activity Baseline: Goal status: New Pt will reduce pain to overall less than 2-3/10 with usual activity and work activity. Baseline: Goal status: New Pt will be able to sit for prolonged periods of time without complaints Baseline: Goal status: New    PLAN: PT FREQUENCY: 1-3 times per week    PT DURATION: 6-8 weeks   PLANNED INTERVENTIONS (unless contraindicated): aquatic PT, Canalith repositioning, cryotherapy, Electrical stimulation, Iontophoresis with 4 mg/ml dexamethasome, Moist heat, traction, Ultrasound, gait training, Therapeutic exercise, balance training, neuromuscular re-education, patient/family education, prosthetic training, manual techniques, passive ROM, dry needling, taping, vasopnuematic device, vestibular, spinal manipulations, joint manipulations   PLAN FOR NEXT SESSION: DN with ES as needed,  strengthen glute and core muscles. Decrease patients pain and help minimize functional deficits.  Lavinia Sharps, PT 01/26/22 5:16 PM Phone: [phone] Fax: [phone]

## 2022-01-28 ENCOUNTER — Ambulatory Visit: Payer: Federal, State, Local not specified - PPO | Admitting: Physical Therapy

## 2022-01-28 DIAGNOSIS — M62830 Muscle spasm of back: Secondary | ICD-10-CM

## 2022-01-28 DIAGNOSIS — M6281 Muscle weakness (generalized): Secondary | ICD-10-CM

## 2022-01-28 DIAGNOSIS — M545 Low back pain, unspecified: Secondary | ICD-10-CM

## 2022-01-28 NOTE — Therapy (Signed)
OUTPATIENT PHYSICAL THERAPY TREATMENT NOTE   Patient Name: Joan Edwards MRN: [id number] DOB:1960/07/08, 61 y.o., female Today's Date: 01/28/2022  PCP: Georgann Housekeeper, MD  REFERRING PROVIDER: Judi Saa, DO   END OF SESSION:   PT End of Session - 01/28/22 1231     Visit Number 4    Number of Visits 16    Date for PT Re-Evaluation 03/05/22    Authorization Type BCBS/[employer] PPO    PT Start Time 1231    PT Stop Time 1315    PT Time Calculation (min) 44 min    Activity Tolerance Patient tolerated treatment well             Past Medical History:  Diagnosis Date   BREAST MASS, LEFT 04/10/2007   Qualifier: Diagnosis of  By: Lovell Sheehan MD, Balinda Quails    Past Surgical History:  Procedure Laterality Date   BREAST CYST EXCISION     DILATION AND CURETTAGE OF UTERUS     tonsil     Patient Active Problem List   Diagnosis Date Noted   Other bursal cyst, left hand 07/02/2020   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    PRECAUTIONS: None   SUBJECTIVE:                                                                                                                                                                                      SUBJECTIVE STATEMENT:   A little sore on both sides.  I can tell a huge difference though- no AM pain/stiffness yesterday.     Eval: Pt reports to PT with an acute on chronic piriforms and SIJ pain. She states that she has been dealing with this for years, but had a flare up  about 2 months ago. Pt states that she had PT in the past and felt that dry needling was very beneficial. She also reports getting a deep tissue massage twice a month which has also really helped, along with yoga.     PAIN:  Are you having pain? Yes: NPRS scale: 2-3/10 Pain location: L side glute, lower back and hip region.; left hip flexor tightness Pain description: Nerve pain, achy.   Aggravating factors: Sitting Relieving  factors: Walking, heat     OBJECTIVE:    DIAGNOSTIC FINDINGS: None    PATIENT SURVEYS:  FOTO 69.01%, 71% in 10 visits.    COGNITION: Overall cognitive status: Within functional limits for tasks assessed                                    SENSATION: WFL     POSTURE: No Significant postural limitations   PALPATION: Multifidi, QL   LOWER EXTREMITY ROM:   Active ROM Right eval Left eval  Hip flexion [facility]  Hip internal rotation [facility]  Hip external rotation [facility]  Knee flexion [facility] WFL  Knee extension WFL WFL   (Blank rows = not tested)   LOWER EXTREMITY MMT:   MMT Right eval Left eval  Hip flexion 4+ 4+  Knee flexion 4+ 4-  Knee extension 4+ 4-   (Blank rows = not tested)   LOWER EXTREMITY SPECIAL TESTS:  Hip special tests: SI compression test: negative and SI distraction test: negative     GAIT: Distance walked: 39ft  Assistive device utilized: None Level of assistance: Complete Independence Comments: No antalgic gait patterns noted.      TODAY'S TREATMENT:  11/16: Nu-Step 5 min L3 while discussing status Pt demos current HEP for QL Hip flexor stretch emphasizing pelvic tilt Sidelying  flex-rotation stretch Manual therapy: soft tissue mobilization to left QL and left lumbar paraspinals Trigger Point Dry-Needling  Treatment instructions: Expect mild to moderate muscle soreness. S/S of pneumothorax if dry needled over a lung field, and to seek immediate medical attention should they occur. Patient verbalized understanding of  these instructions and education.  Patient Consent Given: Yes Education handout provided: previously given Muscles treated: bil lumbar multifidi; left QL, left gluteals/piriformis Electrical stimulation performed: no Parameters: na Treatment response/outcome: improved soft tissue mobility           11/14: Nu-Step 9 min L3 while discussing status Bird dogs Prone over 2 pillows with hip extension in yoga class Primal hover 5 sec holds Pt states modified planks on elbows aggravates her neck but hover planks (arms straight) are not painful Manual therapy: soft tissue mobilization to left QL and left gluteals, piriformis Trigger Point Dry-Needling  Treatment instructions: Expect mild to moderate muscle soreness. S/S of pneumothorax if dry needled over a lung field, and to seek immediate medical attention should they occur. Patient verbalized understanding of these instructions and education.  Patient Consent Given: Yes Education handout provided: previously given Muscles treated: bil lumbar multifidi; left QL, left gluteals/piriformis Electrical stimulation performed: yes Parameters: 80 pps 8 min to bil multifidi Treatment response/outcome: improved soft tissue mobility     01/21/2022: Nustep level 3, 5 min Piriformis 2x 30 sec  LTR x 30  SKTC on L LE only 2 x30 sec  Modified hip flexor stretch R LE only 2x30 sec  Trigger Point Dry-Needling  Treatment instructions: Expect mild to moderate muscle soreness. S/S of pneumothorax if dry needled over a lung field, and to seek immediate medical attention should they occur. Patient verbalized understanding of these instructions and education.  Patient Consent Given: Yes Education handout provided: No Muscles treated: L L3 multifidi (utilizing bracket technique)  Electrical stimulation performed: No Parameters: N/A Treatment response/outcome: Pt reports some relief, no adverse effects noted.  DATE: Reviewed what pt is currently doing at home for HEP.        PATIENT EDUCATION:  Education details: Educated pt on anatomy and physiology of current symptoms, FOTO, diagnosis, prognosis, HEP,  and POC. Person educated: Patient Education method: Medical illustrator Education comprehension: verbalized understanding and returned demonstration   HOME EXERCISE PROGRAM: Access Code: [email or web address] URL: [email or web address] Date: 01/26/2022 Prepared by: Lavinia Sharps  Exercises - Bird Dog  - 1 x daily - 7 x weekly - 1 sets - 10 reps - Primal Push Up  - 1 x daily - 7 x weekly - 1 sets - 10 reps - Prone Hip Extension - Two Pillows  - 1 x daily - 7 x weekly - 1 sets - 10 reps      ASSESSMENT:   CLINICAL IMPRESSION: The patient is highly compliant with an ex program and we have discussed areas of emphasis (QL lengthening; hip flexor lengthening and lumbo/pelvic/hip core strengthening).  Given her knowledge basis of ex, we discussed decreasing treatment frequency to 1x/week then eventually biweekly.  The patient benefits significantly from dry needling and manual therapy to stimulate underlying myofascial trigger points and muscular tissue for management of neuromusculoskeletal pain and address movement impairments.  Much improved soft tissue mobility following treatment session.       OBJECTIVE IMPAIRMENTS: decreased activity tolerance, difficulty walking, decreased balance, decreased endurance, decreased mobility, decreased ROM, decreased strength, impaired flexibility, impaired UE/LE use, postural dysfunction, and pain.   ACTIVITY LIMITATIONS: bending, lifting, carry, locomotion, cleaning, community activity, driving, and or occupation   PERSONAL FACTORS: Osteoperosis are also affecting patient's functional outcome.   REHAB POTENTIAL: Good   CLINICAL DECISION MAKING:  Stable/uncomplicated   EVALUATION COMPLEXITY: Low       GOALS: Short term PT Goals Target date: 02/05/2022 Pt will be I and compliant with HEP. Baseline:  Goal status: New Pt will decrease pain by 25% overall Baseline: Goal status: New   Long term PT goals Target date: 03/05/2022 Pt will improve ROM to [facility] to improve functional mobility Baseline: Goal status: New Pt will improve  hip/knee strength to at least 5-/5 MMT to improve functional strength Baseline: Goal status: New Pt will improve FOTO to at least 71% functional to show improved function Baseline: Goal status: New Pt will reduce pain by overall 50% overall with usual activity Baseline: Goal status: New Pt will reduce pain to overall less than 2-3/10 with usual activity and work activity. Baseline: Goal status: New Pt will be able to sit for prolonged periods of time without complaints Baseline: Goal status: New   PLAN: PT FREQUENCY: 1-3 times per week    PT DURATION: 6-8 weeks   PLANNED INTERVENTIONS (unless contraindicated): aquatic PT, Canalith repositioning, cryotherapy, Electrical stimulation, Iontophoresis with 4 mg/ml dexamethasome, Moist heat, traction, Ultrasound, gait training, Therapeutic exercise, balance training, neuromuscular re-education, patient/family education, prosthetic training, manual techniques, passive ROM, dry needling, taping, vasopnuematic device, vestibular, spinal manipulations, joint manipulations   PLAN FOR NEXT SESSION: decrease treatment frequency; DN with ES as needed,  strengthen glute and core muscles. Decrease patients pain and help minimize functional deficits.  Lavinia Sharps, PT 01/28/22 3:54 PM Phone: [phone] Fax: [phone]

## 2022-02-02 ENCOUNTER — Encounter: Payer: Federal, State, Local not specified - PPO | Admitting: Physical Therapy

## 2022-02-09 ENCOUNTER — Ambulatory Visit: Payer: Federal, State, Local not specified - PPO | Admitting: Physical Therapy

## 2022-02-09 DIAGNOSIS — G8929 Other chronic pain: Secondary | ICD-10-CM

## 2022-02-09 DIAGNOSIS — M545 Low back pain, unspecified: Secondary | ICD-10-CM | POA: Diagnosis not present

## 2022-02-09 DIAGNOSIS — M62830 Muscle spasm of back: Secondary | ICD-10-CM

## 2022-02-09 DIAGNOSIS — M6281 Muscle weakness (generalized): Secondary | ICD-10-CM

## 2022-02-09 NOTE — Therapy (Signed)
OUTPATIENT PHYSICAL THERAPY TREATMENT NOTE   Patient Name: Joan Edwards MRN: [id number] DOB:[birth year], 61 y.o., female Today's Date: 02/09/2022  PCP: Wenda Low, MD  REFERRING PROVIDER: Lyndal Pulley, DO   END OF SESSION:   PT End of Session - 02/09/22 0848     Visit Number 5    Number of Visits 16    Date for PT Re-Evaluation 03/05/22    Authorization Type [employer]BS/[employer] PPO    PT Start Time 0848    PT Stop Time 0928    PT Time Calculation (min) 40 min    Activity Tolerance Patient tolerated treatment well             Past Medical History:  Diagnosis Date   BREAST MASS, LEFT 04/10/2007   Qualifier: Diagnosis of  By: Arnoldo Morale MD, Balinda Quails    Past Surgical History:  Procedure Laterality Date   BREAST CYST EXCISION     DILATION AND CURETTAGE OF UTERUS     tonsil     Patient Active Problem List   Diagnosis Date Noted   Other bursal cyst, left hand 07/02/2020   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    PRECAUTIONS: None   SUBJECTIVE:                                                                                                                                                                                      SUBJECTIVE STATEMENT:   Back is a little cranky today, SI area.  Bothers in the AM.  Better with movement.  Hip better with stretches.   I think I'm good with the exercises.  I have yoga at noon today.     Eval: Pt reports to PT with an acute on chronic piriforms and SIJ  pain. She states that she has been dealing with this for years, but had a flare up about 2 months ago. Pt states that she had PT in the past and felt that dry needling was very beneficial. She also reports getting a deep tissue massage twice a month which has also really helped, along with yoga.     PAIN:  Are you having pain? Yes: NPRS scale: 4-5/10 Pain location: L side glute,  lower back and hip region.; left hip flexor tightness Pain description: Nerve pain, achy.   Aggravating factors: Sitting Relieving factors: Walking, heat     OBJECTIVE:    DIAGNOSTIC FINDINGS: None    PATIENT SURVEYS:  FOTO 69.01%, 71% in 10 visits.    COGNITION: Overall cognitive status: Within functional limits for tasks assessed                                    SENSATION: WFL     POSTURE: No Significant postural limitations   PALPATION: Multifidi, QL   LOWER EXTREMITY ROM:   Active ROM Right eval Left eval  Hip flexion [facility]  Hip internal rotation [facility]  Hip external rotation [facility]  Knee flexion [facility] WFL  Knee extension WFL WFL   (Blank rows = not tested)   LOWER EXTREMITY MMT:   MMT Right eval Left eval  Hip flexion 4+ 4+  Knee flexion 4+ 4-  Knee extension 4+ 4-   (Blank rows = not tested)   LOWER EXTREMITY SPECIAL TESTS:  Hip special tests: SI compression test: negative and SI distraction test: negative     GAIT: Distance walked: 84f  Assistive device utilized: None Level of assistance: Complete Independence Comments: No antalgic gait patterns noted.      TODAY'S TREATMENT:  11/28: Manual therapy: soft tissue mobilization to left QL and left lumbar paraspinals;  neutral gapping;  left hip inferior and AP mobs, long axis distraction grade 3 30 sec each Trigger Point Dry-Needling  Treatment instructions: Expect mild to moderate muscle soreness. S/S of pneumothorax if dry needled over a lung field, and to seek immediate medical attention should they occur. Patient  verbalized understanding of these instructions and education.  Patient Consent Given: Yes Education handout provided: previously given Muscles treated: bil lumbar multifidi prone; left QL, left gluteals/piriformis in sidelying Electrical stimulation performed: yes Parameters: 1.6 ma 80 pps 8 min Treatment response/outcome: improved soft tissue mobility            11/16: Nu-Step 5 min L3 while discussing status Pt demos current HEP for QL Hip flexor stretch emphasizing pelvic tilt Sidelying  flex-rotation stretch Manual therapy: soft tissue mobilization to left QL and left lumbar paraspinals Trigger Point Dry-Needling  Treatment instructions: Expect mild to moderate muscle soreness. S/S of pneumothorax if dry needled over a lung field, and to seek immediate medical attention should they occur. Patient verbalized understanding of these instructions and education.  Patient Consent Given: Yes Education handout provided: previously given Muscles treated: bil lumbar multifidi; left QL, left gluteals/piriformis Electrical stimulation performed: no Parameters: na Treatment response/outcome: improved soft tissue mobility           11/14: Nu-Step 9 min L3 while discussing status Bird dogs Prone over 2 pillows with hip extension in yoga class Primal hover 5 sec holds Pt states modified planks on elbows aggravates her neck but hover planks (arms straight) are not painful Manual therapy: soft tissue mobilization to left QL and left gluteals, piriformis Trigger Point Dry-Needling  Treatment instructions: Expect mild to moderate muscle soreness. S/S of pneumothorax if dry needled over a lung field, and to seek immediate medical attention should they occur. Patient verbalized understanding of these instructions and education.  Patient Consent Given: Yes Education handout provided: previously given Muscles treated: bil lumbar multifidi; left QL, left  gluteals/piriformis Electrical stimulation performed: yes Parameters: 80 pps 8  min to bil multifidi Treatment response/outcome: improved soft tissue mobility         PATIENT EDUCATION:  Education details: Educated pt on anatomy and physiology of current symptoms, FOTO, diagnosis, prognosis, HEP,  and POC. Person educated: Patient Education method: Customer service manager Education comprehension: verbalized understanding and returned demonstration   HOME EXERCISE PROGRAM: Access Code: [email or web address] URL: [email or web address] Date: 01/26/2022 Prepared by: Ruben Im  Exercises - Bird Dog  - 1 x daily - 7 x weekly - 1 sets - 10 reps - Primal Push Up  - 1 x daily - 7 x weekly - 1 sets - 10 reps - Prone Hip Extension - Two Pillows  - 1 x daily - 7 x weekly - 1 sets - 10 reps      ASSESSMENT:   CLINICAL IMPRESSION: The patient benefits significantly from dry needling and manual therapy to stimulate underlying myofascial trigger points and muscular tissue for management of neuromusculoskeletal pain and address movement impairments.   Added ES to DN for longer lasting pain relief benefits and stimulation to deep musculature.  Patient is highly compliant with HEP which will improve long term prognosis.  Therapist monitoring response to all interventions and modifying treatment accordingly.        OBJECTIVE IMPAIRMENTS: decreased activity tolerance, difficulty walking, decreased balance, decreased endurance, decreased mobility, decreased ROM, decreased strength, impaired flexibility, impaired UE/LE use, postural dysfunction, and pain.   ACTIVITY LIMITATIONS: bending, lifting, carry, locomotion, cleaning, community activity, driving, and or occupation   PERSONAL FACTORS: Osteoperosis are also affecting patient's functional outcome.   REHAB POTENTIAL: Good   CLINICAL DECISION MAKING: Stable/uncomplicated   EVALUATION COMPLEXITY: Low       GOALS: Short term PT  Goals Target date: 02/05/2022 Pt will be I and compliant with HEP. Baseline:  Goal status: goal met 11/28 Pt will decrease pain by 25% overall Baseline: Goal status: ongoing   Long term PT goals Target date: 03/05/2022 Pt will improve ROM to [facility] to improve functional mobility Baseline: Goal status: New Pt will improve  hip/knee strength to at least 5-/5 MMT to improve functional strength Baseline: Goal status: New Pt will improve FOTO to at least 71% functional to show improved function Baseline: Goal status: New Pt will reduce pain by overall 50% overall with usual activity Baseline: Goal status: New Pt will reduce pain to overall less than 2-3/10 with usual activity and work activity. Baseline: Goal status: New Pt will be able to sit for prolonged periods of time without complaints Baseline: Goal status: New   PLAN: PT FREQUENCY: 1-3 times per week    PT DURATION: 6-8 weeks   PLANNED INTERVENTIONS (unless contraindicated): aquatic PT, Canalith repositioning, cryotherapy, Electrical stimulation, Iontophoresis with 4 mg/ml dexamethasome, Moist heat, traction, Ultrasound, gait training, Therapeutic exercise, balance training, neuromuscular re-education, patient/family education, prosthetic training, manual techniques, passive ROM, dry needling, taping, vasopnuematic device, vestibular, spinal manipulations, joint manipulations   PLAN FOR NEXT SESSION:  DN with ES as needed, check % improvement per STG;  strengthen glute and core muscles. Decrease patients pain and help minimize functional deficits.  Ruben Im, PT 02/09/22 5:45 PM Phone: [phone] Fax: [phone]

## 2022-02-11 ENCOUNTER — Encounter: Payer: Federal, State, Local not specified - PPO | Admitting: Physical Therapy

## 2022-02-17 ENCOUNTER — Ambulatory Visit: Payer: Federal, State, Local not specified - PPO | Attending: Family Medicine | Admitting: Physical Therapy

## 2022-02-17 DIAGNOSIS — M545 Low back pain, unspecified: Secondary | ICD-10-CM | POA: Diagnosis present

## 2022-02-17 DIAGNOSIS — M5459 Other low back pain: Secondary | ICD-10-CM | POA: Diagnosis present

## 2022-02-17 DIAGNOSIS — M6281 Muscle weakness (generalized): Secondary | ICD-10-CM | POA: Diagnosis present

## 2022-02-17 DIAGNOSIS — M62830 Muscle spasm of back: Secondary | ICD-10-CM | POA: Diagnosis present

## 2022-02-17 DIAGNOSIS — G8929 Other chronic pain: Secondary | ICD-10-CM | POA: Insufficient documentation

## 2022-02-17 NOTE — Therapy (Signed)
OUTPATIENT PHYSICAL THERAPY TREATMENT NOTE   Patient Name: Joan Edwards MRN: [id number] DOB:09-May-1960, 61 y.o., female Today's Date: 02/17/2022  PCP: Wenda Low, MD  REFERRING PROVIDER: Lyndal Pulley, DO   END OF SESSION:   PT End of Session - 02/17/22 1234     Visit Number 6    Number of Visits 16    Date for PT Re-Evaluation 03/05/22    Authorization Type BCBS/F[employer]ER[employer] PPO    PT Start Time 1234    PT Stop Time 1315    PT Time Calculation (min) 41 min    Activity Tolerance Patient tolerated treatment well             Past Medical History:  Diagnosis Date   BREAST MASS, LEFT 04/10/2007   Qualifier: Diagnosis of  By: Arnoldo Morale MD, Balinda Quails    Past Surgical History:  Procedure Laterality Date   BREAST CYST EXCISION     DILATION AND CURETTAGE OF UTERUS     tonsil     Patient Active Problem List   Diagnosis Date Noted   Other bursal cyst, left hand 07/02/2020   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    PRECAUTIONS: None   SUBJECTIVE:                                                                                                                                                                                      SUBJECTIVE STATEMENT:   Going to yoga today.  My body response was no soreness or pain but it felt that it was very internal work.  I don't have the constant, chronic pain that I was.  Rates overall improvement at least 33-40% better overall.    Eval: Pt reports to PT with an acute on chronic  piriforms and SIJ pain. She states that she has been dealing with this for years, but had a flare up about 2 months ago. Pt states that she had PT in the past and felt that dry needling was very beneficial. She also reports getting a deep tissue massage twice a month which has also really helped, along with yoga.     PAIN:  Are you having pain? Yes: NPRS scale: 0/10 Pain location: L side glute, lower back  and hip region.; left hip flexor tightness Pain description: Nerve pain, achy.   Aggravating factors: Sitting Relieving factors: Walking, heat     OBJECTIVE:    DIAGNOSTIC FINDINGS: None    PATIENT SURVEYS:  FOTO 69.01%, 71% in 10 visits.    COGNITION: Overall cognitive status: Within functional limits for tasks assessed                                    SENSATION: WFL     POSTURE: No Significant postural limitations   PALPATION: Multifidi, QL   LOWER EXTREMITY ROM:   Active ROM Right eval Left eval  Hip flexion [facility]  Hip internal rotation [facility]  Hip external rotation [facility]  Knee flexion [facility] WFL  Knee extension WFL WFL   (Blank rows = not tested)   LOWER EXTREMITY MMT:   MMT Right eval Left eval 12/6  Hip flexion 4+ 4+ Left 4+  Knee flexion 4+ 4- Left 4  Knee extension 4+ 4- Left 4   (Blank rows = not tested)   LOWER EXTREMITY SPECIAL TESTS:  Hip special tests: SI compression test: negative and SI distraction test: negative     GAIT: Distance walked: 40f  Assistive device utilized: None Level of assistance: Complete Independence Comments: No antalgic gait patterns noted.      TODAY'S TREATMENT:   02/17/22: Review of current ex program Manual therapy: soft tissue mobilization to lumbar paraspinals, gluteals  Trigger Point Dry-Needling  Treatment instructions: Expect mild to moderate muscle soreness. S/S of pneumothorax if dry needled over a lung field, and to seek immediate medical attention should they occur. Patient verbalized  understanding of these instructions and education.  Patient Consent Given: Yes Education handout provided: previously given Muscles treated: bil lumbar multifidi prone;  left gluteals/piriformis in sidelying Electrical stimulation performed: yes bil lumbar multifidi and left gluteals Parameters: 1.8 ma 80 pps 8 min Treatment response/outcome: improved soft tissue mobility      11/28: Manual therapy: soft tissue mobilization to left QL and left lumbar paraspinals;  neutral gapping;  left hip inferior and AP mobs, long axis distraction grade 3 30 sec each Trigger Point Dry-Needling  Treatment instructions: Expect mild to moderate muscle soreness. S/S of pneumothorax if dry needled over a lung field, and to seek immediate medical attention should they occur. Patient verbalized understanding of these instructions and education.  Patient Consent Given: Yes Education handout provided: previously given Muscles treated: bil lumbar multifidi prone; left QL, left gluteals/piriformis in sidelying Electrical stimulation performed: yes Parameters: 1.6 ma 80 pps 8 min Treatment response/outcome: improved soft tissue mobility            11/16: Nu-Step 5 min L3 while discussing status Pt demos current HEP for QL Hip flexor stretch emphasizing pelvic tilt Sidelying  flex-rotation stretch Manual therapy: soft tissue mobilization to left QL and left lumbar paraspinals Trigger Point Dry-Needling  Treatment instructions: Expect mild to moderate muscle soreness. S/S of pneumothorax if dry needled over a lung field, and to seek immediate medical attention should they occur. Patient verbalized understanding of these instructions and education.  Patient Consent Given: Yes Education handout provided: previously given Muscles treated: bil lumbar multifidi; left QL, left gluteals/piriformis Electrical stimulation performed: no Parameters: na Treatment response/outcome: improved soft tissue  mobility         PATIENT EDUCATION:  Education details: Educated pt on anatomy and physiology of  current symptoms, FOTO, diagnosis, prognosis, HEP,  and POC. Person educated: Patient Education method: Customer service manager Education comprehension: verbalized understanding and returned demonstration   HOME EXERCISE PROGRAM: Access Code: [email or web address] URL: [email or web address] Date: 01/26/2022 Prepared by: Ruben Im  Exercises - Bird Dog  - 1 x daily - 7 x weekly - 1 sets - 10 reps - Primal Push Up  - 1 x daily - 7 x weekly - 1 sets - 10 reps - Prone Hip Extension - Two Pillows  - 1 x daily - 7 x weekly - 1 sets - 10 reps      ASSESSMENT:   CLINICAL IMPRESSION: The patient benefits significantly from dry needling and manual therapy to stimulate underlying myofascial trigger points and muscular tissue for management of neuromusculoskeletal pain and address movement impairments.    Added ES to DN is especially effective in affecting lumbar multifidi positively.  She rates overall improvement since start of care at 33-40% improved.  She is highly compliant with a HEP which will further enhance benefit.     OBJECTIVE IMPAIRMENTS: decreased activity tolerance, difficulty walking, decreased balance, decreased endurance, decreased mobility, decreased ROM, decreased strength, impaired flexibility, impaired UE/LE use, postural dysfunction, and pain.   ACTIVITY LIMITATIONS: bending, lifting, carry, locomotion, cleaning, community activity, driving, and or occupation   PERSONAL FACTORS: Osteoperosis are also affecting patient's functional outcome.   REHAB POTENTIAL: Good   CLINICAL DECISION MAKING: Stable/uncomplicated   EVALUATION COMPLEXITY: Low       GOALS: Short term PT Goals Target date: 02/05/2022 Pt will be I and compliant with HEP. Baseline:  Goal status: goal met 11/28 Pt will decrease pain by 25% overall Baseline: Goal status: goal met 12/6    Long term PT goals Target date: 03/05/2022 Pt will improve ROM to [facility] to improve functional mobility Baseline: Goal status: New Pt will improve  hip/knee strength to at least 5-/5 MMT to improve functional strength Baseline: Goal status: New Pt will improve FOTO to at least 71% functional to show improved function Baseline: Goal status: New Pt will reduce pain by overall 50% overall with usual activity Baseline: Goal status: New Pt will reduce pain to overall less than 2-3/10 with usual activity and work activity. Baseline: Goal status: New Pt will be able to sit for prolonged periods of time without complaints Baseline: Goal status: New   PLAN: PT FREQUENCY: 1-3 times per week    PT DURATION: 6-8 weeks   PLANNED INTERVENTIONS (unless contraindicated): aquatic PT, Canalith repositioning, cryotherapy, Electrical stimulation, Iontophoresis with 4 mg/ml dexamethasome, Moist heat, traction, Ultrasound, gait training, Therapeutic exercise, balance training, neuromuscular re-education, patient/family education, prosthetic training, manual techniques, passive ROM, dry needling, taping, vasopnuematic device, vestibular, spinal manipulations, joint manipulations   PLAN FOR NEXT SESSION:  DN with ES as needed,   strengthen glute and core muscles. Decrease patients pain and help minimize functional deficits.  Ruben Im, PT 02/17/22 1:06 PM Phone: [phone] Fax: [phone]

## 2022-02-18 NOTE — Progress Notes (Signed)
[name] [address] [place] [address] Phone: [phone] Subjective:   Joan Edwards, am serving as a scribe for Dr. Antoine Primas.  I'm seeing this patient by the request  of:  Georgann Housekeeper, MD  CC: back pain follow up   JJO:[id number]  01/06/2022 Patient does have sacroiliac joint dysfunction but also has what appears to be more piriformis.  Some mild radicular symptoms are well started on gabapentin.  Chronic problem with exacerbation noted.  Discussed icing regimen and home exercises.  We will start formal physical therapy which patient did respond to extremely well previously.  Follow-up with me again in 6 to 8 weeks      Update 02/24/2022 Joan Edwards is a 61 y.o. female coming in with complaint of SI joint dysfunction. Patient states dry needling is helping quite a bit, still has some pain but definitely sees improvement.       Past Medical History:  Diagnosis Date   BREAST MASS, LEFT 04/10/2007   Qualifier: Diagnosis of  By: Lovell Sheehan MD, Balinda Quails    Past Surgical History:  Procedure Laterality Date   BREAST CYST EXCISION     DILATION AND CURETTAGE OF UTERUS     tonsil     Social History   Socioeconomic History   Marital status: Married    Spouse name: Not on file   Number of children: Not on file   Years of education: Not on file   Highest education level: Not on file  Occupational History   Not on file  Tobacco Use   Smoking status: Former   Smokeless tobacco: Never   Tobacco comments:    light smoker for 15 years, quit at age 32  Vaping Use   Vaping Use: Never used  Substance and Sexual Activity   Alcohol use: Yes    Comment: occ, 1-2 glass of wine a few times per week   Drug use: Never   Sexual activity: Yes  Other Topics Concern   Not on file  Social History Narrative   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      Home Situation: lives with husband and daughter, son       Spiritual Beliefs: Christian      Lifestyle: yoga once per week - more in the summer, walks a few days per week; diet is fair      Social Determinants of Corporate investment banker Strain: Not on file  Food Insecurity: Not on file  Transportation Needs: Not on file  Physical Activity: Not on file  Stress: Not on file  Social Connections: Not on file   Allergies  Allergen Reactions   Erythromycin     REACTION: Upset GI   Other     Bananas-hives   Polymyxin B-Trimethoprim Itching   Sulfonamide Derivatives     REACTION: itching   Family History  Problem Relation Age of Onset   Hypertension Mother    Hypertension Father       Current Outpatient Medications (Respiratory):    loratadine (CLARITIN) 10 MG tablet, Take 10 mg by mouth daily.   pseudoephedrine (SUDAFED) 30 MG tablet, Take 30 mg by mouth every 4 (four) hours as needed for congestion.    Current Outpatient Medications (Other):    calcium gluconate 500 MG tablet, Take 1 tablet by mouth daily.   cholecalciferol (VITAMIN D) 1000 UNITS tablet, Take 1,000 Units by mouth daily.   famciclovir (FAMVIR) 250  MG tablet, TAKE ONE TABLET BY MOUTH TWICE DAILY FOR 5 DAYS AS NEEDED   gabapentin (NEURONTIN) 100 MG capsule, Take 2 capsules (200 mg total) by mouth at bedtime.   glucosamine-chondroitin 500-400 MG tablet, Take 1 tablet by mouth daily.   MAGNESIUM PO, Take by mouth.   Multiple Vitamin (MULTIVITAMIN) capsule, Take 1 capsule by mouth daily.   Omega-3 Fatty Acids (FISH OIL PO), Take by mouth daily.   triamcinolone (KENALOG) 0.025 % ointment, APPLY TO AFFECTED AREA TWICE A DAY   TURMERIC PO, Take by mouth daily.   Vitamin D, Ergocalciferol, (DRISDOL) 1.25 MG (50000 UNIT) CAPS capsule, TAKE 1 CAPSULE (50,000 UNITS TOTAL) BY MOUTH EVERY 7 (SEVEN) DAYS.     Objective  Blood pressure 118/78, pulse 73, height 5\' 2"  (1.575 m), weight 145 lb (65.8 kg), SpO2 96 %.   General: No apparent distress alert and oriented x3 mood  and affect normal, dressed appropriately.  HEENT: Pupils equal, extraocular movements intact  Respiratory: Patient's speak in full sentences and does not appear short of breath  Cardiovascular: No lower extremity edema, non tender, no erythema  Back exam still has some tightness noted in the sacroiliac joint.  Positive FABER test noted.  Still has some tightness with FABER but has not made improvement.    Impression and Recommendations:    The above documentation has been reviewed and is accurate and complete , DO

## 2022-02-24 ENCOUNTER — Encounter: Payer: Federal, State, Local not specified - PPO | Admitting: Physical Therapy

## 2022-02-24 ENCOUNTER — Ambulatory Visit: Payer: Federal, State, Local not specified - PPO | Admitting: Family Medicine

## 2022-02-24 VITALS — BP 118/78 | HR 73 | Ht 62.0 in | Wt 145.0 lb

## 2022-02-24 DIAGNOSIS — M533 Sacrococcygeal disorders, not elsewhere classified: Secondary | ICD-10-CM

## 2022-02-24 NOTE — Patient Instructions (Signed)
Good to see you  [employer]els See me again in 2 months to check in 1 more time

## 2022-02-24 NOTE — Assessment & Plan Note (Signed)
Continues to be difficult at this time.  Is responding well though to dry needling at the moment.  Discussed posture and ergonomics.  Discussed which activities to do and which ones to avoid.  Discussed tightness with the FABER test still.  We discussed other treatment options including potential injection which patient wants to hold at this moment.  Will follow-up again in 2 months to further evaluate

## 2022-02-25 ENCOUNTER — Ambulatory Visit: Payer: Federal, State, Local not specified - PPO | Admitting: Physical Therapy

## 2022-02-25 DIAGNOSIS — M545 Low back pain, unspecified: Secondary | ICD-10-CM | POA: Diagnosis not present

## 2022-02-25 DIAGNOSIS — G8929 Other chronic pain: Secondary | ICD-10-CM

## 2022-02-25 DIAGNOSIS — M62830 Muscle spasm of back: Secondary | ICD-10-CM

## 2022-02-25 DIAGNOSIS — M6281 Muscle weakness (generalized): Secondary | ICD-10-CM

## 2022-02-25 NOTE — Therapy (Signed)
OUTPATIENT PHYSICAL THERAPY TREATMENT NOTE   Patient Name: Joan Edwards MRN: [id number] DOB:1960-04-25, 61 y.o., female Today's Date: 02/25/2022  PCP: Wenda Low, MD  REFERRING PROVIDER: Lyndal Pulley, DO   END OF SESSION:   PT End of Session - 02/25/22 0930     Visit Number 7    Number of Visits 16    Date for PT Re-Evaluation 03/05/22    Authorization Type BCBS/FEDERAL EMP PPO    PT Start Time 0931    PT Stop Time 1014    PT Time Calculation (min) 43 min    Activity Tolerance Patient tolerated treatment well             Past Medical History:  Diagnosis Date   BREAST MASS, LEFT 04/10/2007   Qualifier: Diagnosis of  By: Arnoldo Morale MD, Balinda Quails    Past Surgical History:  Procedure Laterality Date   BREAST CYST EXCISION     DILATION AND CURETTAGE OF UTERUS     tonsil     Patient Active Problem List   Diagnosis Date Noted   Other bursal cyst, left hand 07/02/2020   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    PRECAUTIONS: None   SUBJECTIVE:                                                                                                                                                                                      SUBJECTIVE STATEMENT:   Doing great.  My son got into [employer] medical school.  Saw Dr. Tamala Julian yesterday.  I'm about 45% better overall. The DN is making a big difference.  I'm not flared up right now.   Deferred shot offered by Dr Tamala Julian since doing pretty well.  I have a massage  tomorrow.     Eval: Pt reports to PT with an acute on chronic piriforms and SIJ pain. She states that she has been dealing with this for years, but had a flare up about 2 months ago. Pt states that she had PT in the past and felt that dry needling was very beneficial. She also reports getting a deep tissue massage twice a month which has also really helped, along with yoga.  PAIN:  Are you having pain? Yes: NPRS scale: 0/10 Pain location: L side glute, lower back and hip region.; left hip flexor tightness Pain description: Nerve pain, achy.   Aggravating factors: Sitting Relieving factors: Walking, heat     OBJECTIVE:    DIAGNOSTIC FINDINGS: None    PATIENT SURVEYS:  FOTO 69.01%, 71% in 10 visits.    COGNITION: Overall cognitive status: Within functional limits for tasks assessed                                    SENSATION: WFL     POSTURE: No Significant postural limitations   PALPATION: Multifidi, QL   LOWER EXTREMITY ROM:   Active ROM Right eval Left eval  Hip flexion [facility]  Hip internal rotation [facility]  Hip external rotation [facility]  Knee flexion [facility] WFL  Knee extension WFL WFL   (Blank rows = not tested)   LOWER EXTREMITY MMT:   MMT Right eval Left eval 12/6  Hip flexion 4+ 4+ Left 4+  Knee flexion 4+ 4- Left 4  Knee extension 4+ 4- Left 4   (Blank rows = not tested)   LOWER EXTREMITY SPECIAL TESTS:  Hip special tests: SI compression test: negative and SI distraction test: negative     GAIT: Distance walked: 21f  Assistive device utilized: None Level of assistance: Complete Independence Comments: No antalgic gait patterns noted.      TODAY'S TREATMENT:  12/14: Discussion of plan of care: ERO next visit with decreased frequency Pt is highly compliant with a regular ex program and does not feel an update is needed at this time Manual therapy: soft tissue mobilization to lumbar paraspinals, gluteals  Trigger Point Dry-Needling   Treatment instructions: Expect mild to moderate muscle soreness. S/S of pneumothorax if dry needled over a lung field, and to seek immediate medical attention should they occur. Patient verbalized understanding of these instructions and education.  Patient Consent Given: Yes Education handout provided: previously given Muscles treated: bil lumbar multifidi prone;  left gluteals/piriformis in sidelying Electrical stimulation performed: yes bil lumbar multifidi and left gluteals Parameters: 1.8 ma 80 pps 8 min Treatment response/outcome: improved soft tissue mobility        02/17/22: Review of current ex program Manual therapy: soft tissue mobilization to lumbar paraspinals, gluteals  Trigger Point Dry-Needling  Treatment instructions: Expect mild to moderate muscle soreness. S/S of pneumothorax if dry needled over a lung field, and to seek immediate medical attention should they occur. Patient verbalized understanding of these instructions and education.  Patient Consent Given: Yes Education handout provided: previously given Muscles treated: bil lumbar multifidi prone;  left gluteals/piriformis in sidelying Electrical stimulation performed: yes bil lumbar multifidi and left gluteals Parameters: 1.8 ma 80 pps 8 min Treatment response/outcome: improved soft tissue mobility      11/28: Manual therapy: soft tissue mobilization to left QL and left lumbar paraspinals;  neutral gapping;  left hip inferior and AP mobs, long axis distraction grade 3 30 sec each Trigger Point Dry-Needling  Treatment instructions: Expect mild to moderate muscle soreness. S/S of pneumothorax if dry needled over a lung field, and to seek immediate medical attention should they occur. Patient verbalized understanding of these instructions and education.  Patient Consent Given: Yes Education handout provided: previously given Muscles treated: bil lumbar multifidi prone; left QL, left gluteals/piriformis in  sidelying Electrical stimulation performed: yes Parameters: 1.6  ma 80 pps 8 min Treatment response/outcome: improved soft tissue mobility           PATIENT EDUCATION:  Education details: Educated pt on anatomy and physiology of current symptoms, FOTO, diagnosis, prognosis, HEP,  and POC. Person educated: Patient Education method: Customer service manager Education comprehension: verbalized understanding and returned demonstration   HOME EXERCISE PROGRAM: Access Code: [email or web address] URL: [email or web address] Date: 01/26/2022 Prepared by: Ruben Im  Exercises - Bird Dog  - 1 x daily - 7 x weekly - 1 sets - 10 reps - Primal Push Up  - 1 x daily - 7 x weekly - 1 sets - 10 reps - Prone Hip Extension - Two Pillows  - 1 x daily - 7 x weekly - 1 sets - 10 reps      ASSESSMENT:   CLINICAL IMPRESSION: The patient has responded well to DN with ES added to address myofascial tender points in bil lumbar multifidi and gluteals to address this chronic issue.  She has a regular ex program that works well for her and she does not feel it needs an update at this time.  We discussed a recheck of progress toward goals and reducing treatment frequency to biweekly and then monthly over the next few months to promote independence with self care and management of this problem.      OBJECTIVE IMPAIRMENTS: decreased activity tolerance, difficulty walking, decreased balance, decreased endurance, decreased mobility, decreased ROM, decreased strength, impaired flexibility, impaired UE/LE use, postural dysfunction, and pain.   ACTIVITY LIMITATIONS: bending, lifting, carry, locomotion, cleaning, community activity, driving, and or occupation   PERSONAL FACTORS: Osteoperosis are also affecting patient's functional outcome.   REHAB POTENTIAL: Good   CLINICAL DECISION MAKING: Stable/uncomplicated   EVALUATION COMPLEXITY: Low       GOALS: Short term PT Goals Target date:  02/05/2022 Pt will be I and compliant with HEP. Baseline:  Goal status: goal met 11/28 Pt will decrease pain by 25% overall Baseline: Goal status: goal met 12/6   Long term PT goals Target date: 03/05/2022 Pt will improve ROM to [facility] to improve functional mobility Baseline: Goal status: New Pt will improve  hip/knee strength to at least 5-/5 MMT to improve functional strength Baseline: Goal status: New Pt will improve FOTO to at least 71% functional to show improved function Baseline: Goal status: New Pt will reduce pain by overall 50% overall with usual activity Baseline: Goal status: ongoing Pt will reduce pain to overall less than 2-3/10 with usual activity and work activity. Baseline: Goal status: New Pt will be able to sit for prolonged periods of time without complaints Baseline: Goal status: New   PLAN: PT FREQUENCY: 1-3 times per week    PT DURATION: 6-8 weeks   PLANNED INTERVENTIONS (unless contraindicated): aquatic PT, Canalith repositioning, cryotherapy, Electrical stimulation, Iontophoresis with 4 mg/ml dexamethasome, Moist heat, traction, Ultrasound, gait training, Therapeutic exercise, balance training, neuromuscular re-education, patient/family education, prosthetic training, manual techniques, passive ROM, dry needling, taping, vasopnuematic device, vestibular, spinal manipulations, joint manipulations   PLAN FOR NEXT SESSION: ERO next visit;  FOTO; MMT left LE; consider decreasing frequency to biweekly with tapering to monthly over a longer period to time 12 weeks;  DN with ES as needed,   update HEP as needed for lumbo/pelvic/hip strengthening  Ruben Im, PT 02/25/22 5:27 PM Phone: [phone] Fax: [phone]

## 2022-03-01 ENCOUNTER — Encounter: Payer: Self-pay | Admitting: Physical Therapy

## 2022-03-01 ENCOUNTER — Ambulatory Visit: Payer: Federal, State, Local not specified - PPO | Admitting: Physical Therapy

## 2022-03-01 DIAGNOSIS — G8929 Other chronic pain: Secondary | ICD-10-CM

## 2022-03-01 DIAGNOSIS — M545 Low back pain, unspecified: Secondary | ICD-10-CM

## 2022-03-01 DIAGNOSIS — M5459 Other low back pain: Secondary | ICD-10-CM

## 2022-03-01 DIAGNOSIS — M6281 Muscle weakness (generalized): Secondary | ICD-10-CM

## 2022-03-01 DIAGNOSIS — M62830 Muscle spasm of back: Secondary | ICD-10-CM

## 2022-03-01 NOTE — Therapy (Signed)
OUTPATIENT PHYSICAL THERAPY TREATMENT NOTE   Patient Name: Joan Edwards MRN: [id number] DOB:[birth year], 61 y.o., female Today's Date: 03/01/2022  PCP: Wenda Low, MD  REFERRING PROVIDER: Lyndal Pulley, DO   END OF SESSION:  Start: 09:31a End: 10:15a Visit number 8   Past Medical History:  Diagnosis Date   BREAST MASS, LEFT 04/10/2007   Qualifier: Diagnosis of  By: Arnoldo Morale MD, Balinda Quails    Past Surgical History:  Procedure Laterality Date   BREAST CYST EXCISION     DILATION AND CURETTAGE OF UTERUS     tonsil     Patient Active Problem List   Diagnosis Date Noted   Other bursal cyst, left hand 07/02/2020   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    PRECAUTIONS: None   SUBJECTIVE:                                                                                                                                                                                      SUBJECTIVE STATEMENT:   Pt states that she is doing really well. She reports continued discomfort upon waking.   Eval: Pt reports to PT with an acute on chronic piriforms and SIJ pain. She states that she has been dealing with this for years, but had a flare up about 2 months ago. Pt states that she had PT in the past and felt that dry needling was very beneficial. She also reports getting a deep tissue massage twice a month which has also really helped, along with yoga.     PAIN:  Are you having pain? Yes: NPRS  scale: 0/10 Pain location: L side glute, lower back and hip region.; left hip flexor tightness Pain description: Nerve pain, achy.   Aggravating factors: Sitting Relieving factors: Walking, heat     OBJECTIVE:    DIAGNOSTIC FINDINGS: None    PATIENT SURVEYS:  FOTO 69.01%, 71% in 10 visits.    COGNITION: Overall cognitive status: Within functional limits for tasks assessed  SENSATION: WFL     POSTURE: No Significant postural limitations   PALPATION: Multifidi, QL   LOWER EXTREMITY ROM:   Active ROM Right eval Left eval  Hip flexion [facility]  Hip internal rotation [facility]  Hip external rotation [facility]  Knee flexion [facility] WFL  Knee extension WFL WFL   (Blank rows = not tested)   LOWER EXTREMITY MMT:   MMT Right eval Left eval 12/6 12/18  Hip flexion 4+ 4+ Left 4+ 5  Knee flexion 4+ 4- Left 4 5  Knee extension 4+ 4- Left 4 5   (Blank rows = not tested)   LOWER EXTREMITY SPECIAL TESTS:  Hip special tests: SI compression test: negative and SI distraction test: negative   GAIT: Distance walked: 74f  Assistive device utilized: None Level of assistance: Complete Independence Comments: No antalgic gait patterns noted.      TODAY'S TREATMENT:  03/01/2022:  Reassessed MMT, and reviewed goals.    Trigger Point Dry-Needling  Treatment instructions: Expect mild to moderate muscle soreness. S/S of pneumothorax if dry needled over a lung field, and to seek immediate medical attention should they occur. Patient verbalized understanding of these instructions and education.  Patient Consent Given: Yes Education handout provided: previously given Muscles treated: bil lumbar multifidi prone;  left gluteals/piriformis in sidelying Electrical stimulation performed: yes bil lumbar multifidi and left gluteals Parameters: 1.8 ma 80 pps 8 min Treatment response/outcome: improved soft tissue mobility   Manual to L piriformis following dry  needling.  Prayer stretch 30 sec in R/L/ Center Reviewed bird dog with cues to level hips Reviewed BConstance Haw Discussed sleeping with pillow under pelvis  12/14: Discussion of plan of care: ERO next visit with decreased frequency Pt is highly compliant with a regular ex program and does not feel an update is needed at this time Manual therapy: soft tissue mobilization to lumbar paraspinals, gluteals  Trigger Point Dry-Needling  Treatment instructions: Expect mild to moderate muscle soreness. S/S of pneumothorax if dry needled over a lung field, and to seek immediate medical attention should they occur. Patient verbalized understanding of these instructions and education.  Patient Consent Given: Yes Education handout provided: previously given Muscles treated: bil lumbar multifidi prone;  left gluteals/piriformis in sidelying Electrical stimulation performed: yes bil lumbar multifidi and left gluteals Parameters: 1.8 ma 80 pps 8 min Treatment response/outcome: improved soft tissue mobility   02/17/22: Review of current ex program Manual therapy: soft tissue mobilization to lumbar paraspinals, gluteals  Trigger Point Dry-Needling  Treatment instructions: Expect mild to moderate muscle soreness. S/S of pneumothorax if dry needled over a lung field, and to seek immediate medical attention should they occur. Patient verbalized understanding of these instructions and education.  Patient Consent Given: Yes Education handout provided: previously given Muscles treated: bil lumbar multifidi prone;  left gluteals/piriformis in sidelying Electrical stimulation performed: yes bil lumbar multifidi and left gluteals Parameters: 1.8 ma 80 pps 8 min Treatment response/outcome: improved soft tissue mobility   11/28: Manual therapy: soft tissue mobilization to left QL and left lumbar paraspinals;  neutral gapping;  left hip inferior and AP mobs, long axis distraction grade 3 30 sec each Trigger Point  Dry-Needling  Treatment instructions: Expect mild to moderate muscle soreness. S/S of pneumothorax if dry needled over a lung field, and to seek immediate medical attention should they occur. Patient verbalized understanding of these instructions and education.  Patient Consent Given: Yes Education handout provided: previously given Muscles treated: bil lumbar multifidi  prone; left QL, left gluteals/piriformis in sidelying Electrical stimulation performed: yes Parameters: 1.6 ma 80 pps 8 min Treatment response/outcome: improved soft tissue mobility   PATIENT EDUCATION:  Education details: Educated pt on anatomy and physiology of current symptoms, FOTO, diagnosis, prognosis, HEP,  and POC. Person educated: Patient Education method: Customer service manager Education comprehension: verbalized understanding and returned demonstration   HOME EXERCISE PROGRAM: Access Code: [email or web address] URL: [email or web address] Date: 01/26/2022 Prepared by: [place] Dog  - 1 x daily - 7 x weekly - 1 sets - 10 reps - Primal Push Up  - 1 x daily - 7 x weekly - 1 sets - 10 reps - Prone Hip Extension - Two Pillows  - 1 x daily - 7 x weekly - 1 sets - 10 reps      ASSESSMENT:   CLINICAL IMPRESSION:  Joan Edwards has progressed well with therapy. She reports 45-55% improvements and states that she notes more pain and discomfort with sedentary days. She continues to have hypotrophy on the L side> R. She continues to respond well to [facility]. Discussed tapering off DN with focus on stretches and core activation with yoga and current HEP. Pt will continue to benefit from skilled PT to address continued underlying deficits then D/C to home HEP, pt agrees with plan. Please see GOALS section for progress on short term and long term goals established at evaluation.   OBJECTIVE IMPAIRMENTS: decreased activity tolerance, difficulty walking, decreased balance, decreased  endurance, decreased mobility, decreased ROM, decreased strength, impaired flexibility, impaired UE/LE use, postural dysfunction, and pain.   ACTIVITY LIMITATIONS: bending, lifting, carry, locomotion, cleaning, community activity, driving, and or occupation   PERSONAL FACTORS: Osteoperosis are also affecting patient's functional outcome.   REHAB POTENTIAL: Good   CLINICAL DECISION MAKING: Stable/uncomplicated   EVALUATION COMPLEXITY: Low       GOALS: Short term PT Goals Target date: 02/05/2022 Pt will be I and compliant with HEP. Baseline:  Goal status: goal met 11/28 Pt will decrease pain by 25% overall Baseline: Goal status: goal met 12/6   Long term PT goals Target date: 05/31/2022  Pt will improve ROM to [facility] to improve functional mobility Baseline: Goal status: MET 03/01/2022 Pt will improve  hip/knee strength to at least 5-/5 MMT to improve functional strength Baseline: Goal status: New Pt will improve FOTO to at least 71% functional to show improved function Baseline: Goal status: 71.72. MET 03/01/2022 Pt will reduce pain by overall 50% overall with usual activity Baseline: Goal status: MET 03/01/2022 Pt will reduce pain to overall less than 2-3/10 with usual activity and work activity. Baseline: Goal status: ONGOING 3/10 on average.  Pt will be able to sit for prolonged periods of time without complaints Baseline: Goal status: Pt state states that she can sit for 30 min prior to having onset of pain upon standing.        7.  Pt would like to decrease reliance on dry needling, with continued 3/10 pain.        Baseline:         Goal Status: New   PLAN: PT FREQUENCY: 1x bi- weekly, tapering to 1 x monthly.   PT DURATION: 12 weeks   PLANNED INTERVENTIONS (unless contraindicated): aquatic PT, Canalith repositioning, cryotherapy, Electrical stimulation, Iontophoresis with 4 mg/ml dexamethasome, Moist heat, traction, Ultrasound, gait training, Therapeutic exercise,  balance training, neuromuscular re-education, patient/family education, prosthetic training, manual techniques, passive ROM, dry needling, taping, vasopnuematic device, vestibular, spinal  manipulations, joint manipulations   PLAN FOR NEXT SESSION: Consider decreasing frequency to biweekly with tapering to monthly over a longer period of 12 weeks;  DN with ES as needed, update HEP as needed for lumbo/pelvic/hip strengthening  Rudi Heap PT, DPT 03/01/22  2:00 PM

## 2022-03-17 NOTE — Therapy (Unsigned)
OUTPATIENT PHYSICAL THERAPY TREATMENT NOTE   Patient Name: Joan Edwards MRN: [id number] DOB:1960-09-04, 62 y.o., female Today's Date: 03/18/2022  PCP: Wenda Low, MD  REFERRING PROVIDER: Lyndal Pulley, DO   END OF SESSION:  Start: 09:31a End: 10:15a Visit number 8   Past Medical History:  Diagnosis Date   BREAST MASS, LEFT 04/10/2007   Qualifier: Diagnosis of  By: Arnoldo Morale MD, Balinda Quails    Past Surgical History:  Procedure Laterality Date   BREAST CYST EXCISION     DILATION AND CURETTAGE OF UTERUS     tonsil     Patient Active Problem List   Diagnosis Date Noted   Other bursal cyst, left hand 07/02/2020   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    PRECAUTIONS: None   SUBJECTIVE:                                                                                                                                                                                      SUBJECTIVE STATEMENT:   Pt states that's that she has not been able to get a massage since her last session. She reports being more intentional about her sleeping position which has been beneficial for her pain. Pt also reports an onset of rash to torso limiting her ability to be active with her stretches.   Eval: Pt reports to PT with an acute on chronic piriforms and SIJ pain. She states that she has been dealing with this for years, but had a flare up about 2 months ago. Pt states that she had PT in the past and felt  that dry needling was very beneficial. She also reports getting a deep tissue massage twice a month which has also really helped, along with yoga.     PAIN:  Are you having pain? Yes: NPRS scale: 0/10 Pain location: L side glute, lower back and hip region.; left hip flexor tightness Pain description: Nerve pain, achy.   Aggravating factors: Sitting Relieving factors: Walking, heat     OBJECTIVE:    DIAGNOSTIC FINDINGS: None    PATIENT SURVEYS:  FOTO 69.01%, 71% in 10 visits.    COGNITION: Overall cognitive status: Within functional limits for  tasks assessed                                    SENSATION: WFL     POSTURE: No Significant postural limitations   PALPATION: Multifidi, QL   LOWER EXTREMITY ROM:   Active ROM Right eval Left eval  Hip flexion [facility]  Hip internal rotation [facility]  Hip external rotation [facility]  Knee flexion [facility] WFL  Knee extension WFL WFL   (Blank rows = not tested)   LOWER EXTREMITY MMT:   MMT Right eval Left eval 12/6 12/18  Hip flexion 4+ 4+ Left 4+ 5  Knee flexion 4+ 4- Left 4 5  Knee extension 4+ 4- Left 4 5   (Blank rows = not tested)   LOWER EXTREMITY SPECIAL TESTS:  Hip special tests: SI compression test: negative and SI distraction test: negative   GAIT: Distance walked: 45f  Assistive device utilized: None Level of assistance: Complete Independence Comments: No antalgic gait patterns noted.      TODAY'S TREATMENT:  03/18/2022: Trigger Point Dry-Needling  Treatment instructions: Expect mild to moderate muscle soreness. S/S of pneumothorax if dry needled over a lung field, and to seek immediate medical attention should they occur. Patient verbalized understanding of these instructions and education.  Patient Consent Given: Yes Education handout provided: previously given Muscles treated: bil lumbar multifidi prone;  left gluteals/piriformis in sidelying Electrical stimulation performed: Not today Parameters: Not  today  Treatment response/outcome: improved soft tissue mobility   Manual to L piriformis following dry needling.  Prayer stretch 30 sec in R/L/ Center Passive stretching to L piriformis  03/01/2022:  Reassessed MMT, and reviewed goals.    Trigger Point Dry-Needling  Treatment instructions: Expect mild to moderate muscle soreness. S/S of pneumothorax if dry needled over a lung field, and to seek immediate medical attention should they occur. Patient verbalized understanding of these instructions and education.  Patient Consent Given: Yes Education handout provided: previously given Muscles treated: bil lumbar multifidi prone;  left gluteals/piriformis in sidelying Electrical stimulation performed: yes bil lumbar multifidi and left gluteals Parameters: 1.8 ma 80 pps 8 min Treatment response/outcome: improved soft tissue mobility   Manual to L piriformis following dry needling.  Prayer stretch 30 sec in R/L/ Center Reviewed bird dog with cues to level hips Reviewed BConstance Haw Discussed sleeping with pillow under pelvis  12/14: Discussion of plan of care: ERO next visit with decreased frequency Pt is highly compliant with a regular ex program and does not feel an update is needed at this time Manual therapy: soft tissue mobilization to lumbar paraspinals, gluteals  Trigger Point Dry-Needling  Treatment instructions: Expect mild to moderate muscle soreness. S/S of pneumothorax if dry needled over a lung field, and to seek immediate medical attention should they occur. Patient verbalized understanding of these instructions and education.  Patient Consent Given: Yes Education handout provided: previously given Muscles treated: bil lumbar multifidi prone;  left gluteals/piriformis in sidelying Electrical stimulation performed: yes bil lumbar multifidi and left gluteals Parameters: 1.8 ma 80 pps 8 min Treatment response/outcome: improved soft tissue mobility   02/17/22: Review of  current ex program Manual therapy: soft tissue mobilization to lumbar paraspinals, gluteals  Trigger Point Dry-Needling  Treatment instructions: Expect mild to moderate muscle soreness. S/S of pneumothorax if dry needled over a lung field, and to seek immediate medical attention should they occur. Patient verbalized understanding  of these instructions and education.  Patient Consent Given: Yes Education handout provided: previously given Muscles treated: bil lumbar multifidi prone;  left gluteals/piriformis in sidelying Electrical stimulation performed: yes bil lumbar multifidi and left gluteals Parameters: 1.8 ma 80 pps 8 min Treatment response/outcome: improved soft tissue mobility   11/28: Manual therapy: soft tissue mobilization to left QL and left lumbar paraspinals;  neutral gapping;  left hip inferior and AP mobs, long axis distraction grade 3 30 sec each Trigger Point Dry-Needling  Treatment instructions: Expect mild to moderate muscle soreness. S/S of pneumothorax if dry needled over a lung field, and to seek immediate medical attention should they occur. Patient verbalized understanding of these instructions and education.  Patient Consent Given: Yes Education handout provided: previously given Muscles treated: bil lumbar multifidi prone; left QL, left gluteals/piriformis in sidelying Electrical stimulation performed: yes Parameters: 1.6 ma 80 pps 8 min Treatment response/outcome: improved soft tissue mobility   PATIENT EDUCATION:  Education details: Educated pt on anatomy and physiology of current symptoms, FOTO, diagnosis, prognosis, HEP,  and POC. Person educated: Patient Education method: Customer service manager Education comprehension: verbalized understanding and returned demonstration   HOME EXERCISE PROGRAM: Access Code: [email or web address] URL: [email or web address] Date: 01/26/2022 Prepared by: Ruben Im  Exercises - Bird Dog  - 1 x daily - 7 x  weekly - 1 sets - 10 reps - Primal Push Up  - 1 x daily - 7 x weekly - 1 sets - 10 reps - Prone Hip Extension - Two Pillows  - 1 x daily - 7 x weekly - 1 sets - 10 reps      ASSESSMENT:   CLINICAL IMPRESSION: Pt presents to Pt after the holiday's with increased tension reported in her lower lumbar and L hip. She reports being active with her stretches, but hasn't been able to get a massage due to an active rash on her torso. Pt with significant amount of muscle tension noted in her lower L lumbar paraspinals and multifidi today. DN and STM performed to the area with improvements in muscle tissue noted. Pt also with tension in L piriformis. DN and manual stretches with pt reporting significant improvements upon standing today. She plans to follow up with her massage therapist. She was encouraged to continue with stretches and mobility exercises between now and next session to maintain current tissue extensibility.   OBJECTIVE IMPAIRMENTS: decreased activity tolerance, difficulty walking, decreased balance, decreased endurance, decreased mobility, decreased ROM, decreased strength, impaired flexibility, impaired UE/LE use, postural dysfunction, and pain.   ACTIVITY LIMITATIONS: bending, lifting, carry, locomotion, cleaning, community activity, driving, and or occupation   PERSONAL FACTORS: Osteoperosis are also affecting patient's functional outcome.   REHAB POTENTIAL: Good   CLINICAL DECISION MAKING: Stable/uncomplicated   EVALUATION COMPLEXITY: Low       GOALS: Short term PT Goals Target date: 02/05/2022 Pt will be I and compliant with HEP. Baseline:  Goal status: goal met 11/28 Pt will decrease pain by 25% overall Baseline: Goal status: goal met 12/6   Long term PT goals Target date: 05/31/2022  Pt will improve ROM to [facility] to improve functional mobility Baseline: Goal status: MET 03/01/2022 Pt will improve  hip/knee strength to at least 5-/5 MMT to improve functional  strength Baseline: Goal status: New Pt will improve FOTO to at least 71% functional to show improved function Baseline: Goal status: 71.72. MET 03/01/2022 Pt will reduce pain by overall 50% overall with usual activity Baseline: Goal status: MET 03/01/2022 Pt will reduce  pain to overall less than 2-3/10 with usual activity and work activity. Baseline: Goal status: ONGOING 3/10 on average.  Pt will be able to sit for prolonged periods of time without complaints Baseline: Goal status: Pt state states that she can sit for 30 min prior to having onset of pain upon standing.        7.  Pt would like to decrease reliance on dry needling, with continued 3/10 pain.        Baseline:         Goal Status: New   PLAN: PT FREQUENCY: 1x bi- weekly, tapering to 1 x monthly.   PT DURATION: 12 weeks   PLANNED INTERVENTIONS (unless contraindicated): aquatic PT, Canalith repositioning, cryotherapy, Electrical stimulation, Iontophoresis with 4 mg/ml dexamethasome, Moist heat, traction, Ultrasound, gait training, Therapeutic exercise, balance training, neuromuscular re-education, patient/family education, prosthetic training, manual techniques, passive ROM, dry needling, taping, vasopnuematic device, vestibular, spinal manipulations, joint manipulations   PLAN FOR NEXT SESSION: Consider continuing with biweekly with tapering to monthly over a longer period of 12 weeks;  DN with ES as needed, update HEP as needed for lumbo/pelvic/hip strengthening  Rudi Heap PT, DPT 03/18/22  10:20 AM

## 2022-03-18 ENCOUNTER — Ambulatory Visit: Payer: Federal, State, Local not specified - PPO | Attending: Family Medicine | Admitting: Physical Therapy

## 2022-03-18 DIAGNOSIS — M62830 Muscle spasm of back: Secondary | ICD-10-CM | POA: Insufficient documentation

## 2022-03-18 DIAGNOSIS — M545 Low back pain, unspecified: Secondary | ICD-10-CM | POA: Insufficient documentation

## 2022-03-18 DIAGNOSIS — M5459 Other low back pain: Secondary | ICD-10-CM | POA: Insufficient documentation

## 2022-03-18 DIAGNOSIS — M6281 Muscle weakness (generalized): Secondary | ICD-10-CM | POA: Insufficient documentation

## 2022-03-18 DIAGNOSIS — G8929 Other chronic pain: Secondary | ICD-10-CM | POA: Insufficient documentation

## 2022-03-30 ENCOUNTER — Ambulatory Visit: Payer: Federal, State, Local not specified - PPO | Admitting: Physical Therapy

## 2022-03-30 DIAGNOSIS — M62830 Muscle spasm of back: Secondary | ICD-10-CM | POA: Diagnosis present

## 2022-03-30 DIAGNOSIS — M545 Low back pain, unspecified: Secondary | ICD-10-CM | POA: Diagnosis present

## 2022-03-30 DIAGNOSIS — G8929 Other chronic pain: Secondary | ICD-10-CM | POA: Diagnosis present

## 2022-03-30 DIAGNOSIS — M5459 Other low back pain: Secondary | ICD-10-CM | POA: Diagnosis present

## 2022-03-30 DIAGNOSIS — M6281 Muscle weakness (generalized): Secondary | ICD-10-CM

## 2022-03-30 NOTE — Therapy (Signed)
OUTPATIENT PHYSICAL THERAPY TREATMENT NOTE   Patient Name: Joan Edwards MRN: [id number] DOB:Oct 09, 1960, 62 y.o., female Today's Date: 03/30/2022  PCP: Georgann Housekeeper, MD  REFERRING PROVIDER: Judi Saa, DO   END OF SESSION:   PT End of Session - 03/30/22 0935     Visit Number 9   Number of Visits 24    Date for PT Re-Evaluation 05/31/22    Authorization Type [employer] P[employer]    PT Start Time 0933    PT Stop Time 1020   PT Time Calculation (min) 47 min    Activity Tolerance Patient tolerated treatment well             Past Medical History:  Diagnosis Date   BREAST MASS, LEFT 04/10/2007   Qualifier: Diagnosis of  By: Lovell Sheehan MD, Balinda Quails    Past Surgical History:  Procedure Laterality Date   BREAST CYST EXCISION     DILATION AND CURETTAGE OF UTERUS     tonsil     Patient Active Problem List   Diagnosis Date Noted   Other bursal cyst, left hand 07/02/2020   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    PRECAUTIONS: None   SUBJECTIVE:                                                                                                                                                                                      SUBJECTIVE STATEMENT:   My massage therapist comes back next week.  That affects me not having my usual massage therapist.  Drove to [facility].  Tightness left SI and QL.  Already did 30 min of stretches this morning.     Eval: Pt reports to PT with an acute on chronic  piriforms and SIJ pain. She states that she has been dealing with this for years, but had a flare up about 2 months ago. Pt states that she had PT in the past and felt that dry needling was very beneficial. She also reports getting a deep tissue massage twice a month which has also really helped, along with yoga.     PAIN:  Are you having pain? Yes: NPRS scale: 0/10 Pain location: L side glute, lower back and  hip region.; left hip flexor tightness Pain description: Nerve pain, achy.   Aggravating factors: Sitting Relieving factors: Walking, heat     OBJECTIVE:    DIAGNOSTIC FINDINGS: None    PATIENT SURVEYS:  FOTO 69.01%, 71% in 10 visits.    COGNITION: Overall cognitive status: Within functional limits for tasks assessed                                    SENSATION: WFL     POSTURE: No Significant postural limitations   PALPATION: Multifidi, QL   LOWER EXTREMITY ROM:   Active ROM Right eval Left eval  Hip flexion [facility]  Hip internal rotation [facility]  Hip external rotation [facility]  Knee flexion [facility] WFL  Knee extension WFL WFL   (Blank rows = not tested)   LOWER EXTREMITY MMT:   MMT Right eval Left eval 12/6 12/18  Hip flexion 4+ 4+ Left 4+ 5  Knee flexion 4+ 4- Left 4 5  Knee extension 4+ 4- Left 4 5   (Blank rows = not tested)   LOWER EXTREMITY SPECIAL TESTS:  Hip special tests: SI compression test: negative and SI distraction test: negative   GAIT: Distance walked: 65ft  Assistive device utilized: None Level of assistance: Complete Independence Comments: No antalgic gait patterns noted.      TODAY'S TREATMENT:  03/30/2022: Trigger Point Dry-Needling  Treatment instructions: Expect mild to moderate muscle soreness. S/S of pneumothorax if dry needled over a lung field, and to seek immediate medical attention should they occur. Patient verbalized understanding of these instructions and education.  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 S/S of pneumothorax if dry needled over a lung field, and to seek immediate medical attention should they occur. Patient verbalized understanding of these instructions and education.  Patient Consent Given: Yes Education handout provided: previously given Muscles treated: bil lumbar multifidi prone;  left gluteals/piriformis in sidelying Electrical stimulation performed: Not today Parameters: Not today  Treatment response/outcome: improved soft tissue mobility   Manual to L piriformis following dry needling.  Prayer stretch 30 sec in R/L/ Center Passive stretching to L piriformis  03/01/2022:  Reassessed MMT, and reviewed goals.    Trigger Point Dry-Needling  Treatment instructions: Expect mild to moderate muscle soreness. S/S of pneumothorax if dry needled over a lung field, and to seek immediate medical attention should they occur. Patient verbalized understanding of these instructions and education.  Patient Consent Given: Yes Education handout provided: previously given Muscles treated: bil lumbar multifidi prone;  left gluteals/piriformis in sidelying Electrical stimulation performed: yes bil lumbar multifidi and left gluteals Parameters: 1.8 ma 80 pps 8 min Treatment response/outcome: improved  soft tissue mobility   Manual to L piriformis following dry needling.  Prayer stretch 30 sec in R/L/ Center Reviewed bird dog with cues  to level hips Reviewed Constance Haw  Discussed sleeping with pillow under pelvis  12/14: Discussion of plan of care: ERO next visit with decreased frequency Pt is highly compliant with a regular ex program and does not feel an update is needed at this time Manual therapy: soft tissue mobilization to lumbar paraspinals, gluteals  Trigger Point Dry-Needling  Treatment instructions: Expect mild to moderate muscle soreness. S/S of pneumothorax if dry needled over a lung field, and to seek immediate medical attention should they occur. Patient verbalized understanding of these instructions and education.  Patient Consent Given: Yes Education handout provided: previously given Muscles treated: bil lumbar multifidi prone;  left gluteals/piriformis in sidelying Electrical stimulation performed: yes bil lumbar multifidi and left gluteals Parameters: 1.8 ma 80 pps 8 min Treatment response/outcome: improved soft tissue mobility  PATIENT EDUCATION:  Education details: Educated pt on anatomy and physiology of current symptoms, FOTO, diagnosis, prognosis, HEP,  and POC. Person educated: Patient Education method: Customer service manager Education comprehension: verbalized understanding and returned demonstration   HOME EXERCISE PROGRAM: Access Code: [email or web address] URL: [email or web address] Date: 03/30/2022 Prepared by: Ruben Im  Exercises - Bird Dog  - 1 x daily - 7 x weekly - 1 sets - 10 reps - Primal Push Up  - 1 x daily - 7 x weekly - 1 sets - 10 reps - Prone Hip Extension - Two Pillows  - 1 x daily - 7 x weekly - 1 sets - 10 reps - Quadruped Hip Abduction and External Rotation  - 1 x daily - 7 x weekly - 1 sets - 10 reps     ASSESSMENT:   CLINICAL IMPRESSION: The patient benefits significantly from dry needling combined with ES and  manual therapy to stimulate underlying myofascial trigger points and muscular tissue for management of neuromusculoskeletal pain and address movement impairments.  Much improved soft tissue mobility and decreased tender point size and number following treatment session.  Patient is highly compliant with an ex program which should further benefit mobility and strength and improve symptoms.     OBJECTIVE IMPAIRMENTS: decreased activity tolerance, difficulty walking, decreased balance, decreased endurance, decreased mobility, decreased ROM, decreased strength, impaired flexibility, impaired UE/LE use, postural dysfunction, and pain.   ACTIVITY LIMITATIONS: bending, lifting, carry, locomotion, cleaning, community activity, driving, and or occupation   PERSONAL FACTORS: Osteoperosis are also affecting patient's functional outcome.   REHAB POTENTIAL: Good   CLINICAL DECISION MAKING: Stable/uncomplicated   EVALUATION COMPLEXITY: Low       GOALS: Short term PT Goals Target date: 02/05/2022 Pt will be I and compliant with HEP. Baseline:  Goal status: goal met 11/28 Pt will decrease pain by 25% overall Baseline: Goal status: goal met 12/6   Long term PT goals Target date: 05/31/2022  Pt will improve ROM to [facility] to improve functional mobility Baseline: Goal status: MET 03/01/2022 Pt will improve  hip/knee strength to at least 5-/5 MMT to improve functional strength Baseline: Goal status: New Pt will improve FOTO to at least 71% functional to show improved function Baseline: Goal status: 71.72. MET 03/01/2022 Pt will reduce pain by overall 50% overall with usual activity Baseline: Goal status: MET 03/01/2022 Pt will reduce pain to overall less than 2-3/10 with usual activity and work activity. Baseline: Goal status: ONGOING 3/10 on average.  Pt will be able to sit for prolonged periods of time without complaints Baseline: Goal status: Pt state states that she can sit for 30 min prior  to having onset of pain upon  standing.        7.  Pt would like to decrease reliance on dry needling, with continued 3/10 pain.        Baseline:         Goal Status: New   PLAN: PT FREQUENCY: 1x bi- weekly, tapering to 1 x monthly.   PT DURATION: 12 weeks   PLANNED INTERVENTIONS (unless contraindicated): aquatic PT, Canalith repositioning, cryotherapy, Electrical stimulation, Iontophoresis with 4 mg/ml dexamethasome, Moist heat, traction, Ultrasound, gait training, Therapeutic exercise, balance training, neuromuscular re-education, patient/family education, prosthetic training, manual techniques, passive ROM, dry needling, taping, vasopnuematic device, vestibular, spinal manipulations, joint manipulations   PLAN FOR NEXT SESSION: Consider continuing with biweekly with tapering to monthly over a longer period of 12 weeks;  DN with ES as needed, update HEP as needed for lumbo/pelvic/hip strengthening emphasize glute med strengthening (add standing options)  Lavinia Sharps, PT 03/30/22 7:18 PM Phone: [phone] Fax: [phone]

## 2022-04-12 ENCOUNTER — Ambulatory Visit: Payer: Federal, State, Local not specified - PPO | Admitting: Physical Therapy

## 2022-04-12 DIAGNOSIS — M5459 Other low back pain: Secondary | ICD-10-CM

## 2022-04-12 DIAGNOSIS — M6281 Muscle weakness (generalized): Secondary | ICD-10-CM

## 2022-04-12 DIAGNOSIS — M545 Low back pain, unspecified: Secondary | ICD-10-CM

## 2022-04-12 DIAGNOSIS — M62830 Muscle spasm of back: Secondary | ICD-10-CM

## 2022-04-12 NOTE — Therapy (Signed)
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    PRECAUTIONS: None   SUBJECTIVE:                                                                                                                                                                                      SUBJECTIVE STATEMENT:   Pt states that she is noting increased tension due to not getting massage and DN regularly.   Eval: Pt reports to PT with an acute on chronic piriforms and SIJ pain. She states that she has been dealing with this for years, but had a flare up about 2 months ago. Pt states that she had PT in the past and felt that dry needling was very beneficial. She also reports getting a deep tissue massage twice a month which has also really helped, along with yoga.     PAIN:  Are you having pain? Yes: NPRS scale: 0/10 Pain location: L side glute,  lower back and hip region.; left hip flexor tightness Pain description: Nerve pain, achy.   Aggravating factors: Sitting Relieving factors: Walking, heat     OBJECTIVE:    DIAGNOSTIC FINDINGS: None    PATIENT SURVEYS:  FOTO 69.01%, 71% in 10 visits.    COGNITION: Overall cognitive status: Within functional limits for tasks assessed                                    SENSATION: [facility]  POSTURE: No Significant postural limitations   PALPATION: Multifidi, QL   LOWER EXTREMITY ROM:   Active ROM Right eval Left eval  Hip flexion [facility]  Hip internal rotation [facility]  Hip external rotation [facility]  Knee flexion [facility] WFL  Knee extension WFL WFL   (Blank rows = not tested)   LOWER EXTREMITY MMT:   MMT Right eval Left eval 12/6 12/18  Hip flexion 4+ 4+ Left 4+ 5  Knee flexion 4+ 4- Left 4 5  Knee extension 4+ 4- Left 4 5   (Blank rows = not tested)   LOWER EXTREMITY SPECIAL TESTS:  Hip special tests: SI compression test: negative and SI distraction test: negative   GAIT: Distance walked: 89ft  Assistive device utilized: None Level of assistance: Complete Independence Comments: No antalgic gait patterns noted.      TODAY'S TREATMENT:  03/30/2022: Trigger Point Dry-Needling  Treatment instructions: Expect mild to moderate muscle soreness. S/S of pneumothorax if dry needled over a lung field, and to seek immediate medical attention should they occur. Patient verbalized understanding of these instructions and education. Patient Consent Given: Yes Education handout provided: previously given Muscles treated: bil lumbar multifidi prone;  left gluteals/piriformis QL and lumbar parspinals left threading in sidelying Electrical stimulation performed: yes bil lumbar multifidi  Parameters: 1.8 ma 80 pps 8 min Treatment response/outcome: improved soft tissue mobility  Quadruped and standing "fire hydrants" to activate gluteals Manual therapy: soft tissue mobilization to  lumbar paraspinals  03/18/2022: Trigger Point Dry-Needling  Treatment instructions: Expect mild to moderate muscle soreness. S/S of pneumothorax if dry needled over a lung field, and to seek immediate medical attention should they occur. Patient verbalized understanding of these instructions and education.  Patient Consent Given: Yes Education handout provided: previously given Muscles treated: bil lumbar multifidi prone;  left gluteals/piriformis in sidelying Electrical stimulation performed: Not today Parameters: Not today  Treatment response/outcome: improved soft tissue mobility   Manual to L piriformis following dry needling.  Prayer stretch 30 sec in R/L/ Center Passive stretching to L piriformis  03/01/2022:  Reassessed MMT, and reviewed goals.    Trigger Point Dry-Needling  Treatment instructions: Expect mild to moderate muscle soreness. S/S of pneumothorax if dry needled over a lung field, and to seek immediate medical attention should they occur. Patient verbalized understanding of these instructions and education.  Patient Consent Given: Yes Education handout provided: previously given Muscles treated: bil lumbar multifidi prone;  left gluteals/piriformis in sidelying Electrical stimulation performed: yes bil lumbar multifidi and left gluteals Parameters: 1.8 ma 80 pps 8 min Treatment response/outcome: improved soft tissue mobility   Manual to L piriformis following dry needling.  Prayer stretch 30 sec in R/L/ Center Reviewed bird dog with cues to level hips Reviewed Constance Haw  Discussed sleeping with pillow under pelvis  12/14: Discussion of plan of care: ERO next visit with decreased frequency Pt is highly compliant with a regular ex program and does not feel an update is needed at this time Manual therapy: soft tissue mobilization to lumbar paraspinals, gluteals  Trigger Point Dry-Needling  Treatment instructions: Expect mild to moderate muscle soreness. S/S of  pneumothorax if dry needled over a lung field, and to seek immediate medical attention should they occur. Patient verbalized understanding of these instructions and education.  Patient Consent Given: Yes Education handout provided: previously given Muscles treated: bil lumbar multifidi prone;  left gluteals/piriformis in sidelying Electrical stimulation performed: yes bil lumbar multifidi and left gluteals Parameters: 1.8 ma  80 pps 8 min Treatment response/outcome: improved soft tissue mobility  PATIENT EDUCATION:  Education details: Educated pt on anatomy and physiology of current symptoms, FOTO, diagnosis, prognosis, HEP,  and POC. Person educated: Patient Education method: Customer service manager Education comprehension: verbalized understanding and returned demonstration   HOME EXERCISE PROGRAM: Access Code: [email or web address] URL: [email or web address] Date: 03/30/2022 Prepared by: Ruben Im  Exercises - Bird Dog  - 1 x daily - 7 x weekly - 1 sets - 10 reps - Primal Push Up  - 1 x daily - 7 x weekly - 1 sets - 10 reps - Prone Hip Extension - Two Pillows  - 1 x daily - 7 x weekly - 1 sets - 10 reps - Quadruped Hip Abduction and External Rotation  - 1 x daily - 7 x weekly - 1 sets - 10 reps     ASSESSMENT:   CLINICAL IMPRESSION: Due to significant pain and muscle tension relief from dry needling combined with ES and manual therapy last session, continued this session with improvements noted to tissue following. Patient is highly compliant with an ex program and plans to continue with regularly scheduled massages to help with continued pain relief.    OBJECTIVE IMPAIRMENTS: decreased activity tolerance, difficulty walking, decreased balance, decreased endurance, decreased mobility, decreased ROM, decreased strength, impaired flexibility, impaired UE/LE use, postural dysfunction, and pain.   ACTIVITY LIMITATIONS: bending, lifting, carry, locomotion, cleaning,  community activity, driving, and or occupation   PERSONAL FACTORS: Osteoperosis are also affecting patient's functional outcome.   REHAB POTENTIAL: Good   CLINICAL DECISION MAKING: Stable/uncomplicated   EVALUATION COMPLEXITY: Low       GOALS: Short term PT Goals Target date: 02/05/2022 Pt will be I and compliant with HEP. Baseline:  Goal status: goal met 11/28 Pt will decrease pain by 25% overall Baseline: Goal status: goal met 12/6   Long term PT goals Target date: 05/31/2022  Pt will improve ROM to [facility] to improve functional mobility Baseline: Goal status: MET 03/01/2022 Pt will improve  hip/knee strength to at least 5-/5 MMT to improve functional strength Baseline: Goal status: New Pt will improve FOTO to at least 71% functional to show improved function Baseline: Goal status: 71.72. MET 03/01/2022 Pt will reduce pain by overall 50% overall with usual activity Baseline: Goal status: MET 03/01/2022 Pt will reduce pain to overall less than 2-3/10 with usual activity and work activity. Baseline: Goal status: ONGOING 3/10 on average.  Pt will be able to sit for prolonged periods of time without complaints Baseline: Goal status: Pt state states that she can sit for 30 min prior to having onset of pain upon standing.        7.  Pt would like to decrease reliance on dry needling, with continued 3/10 pain.        Baseline:         Goal Status: New   PLAN: PT FREQUENCY: 1x bi- weekly, tapering to 1 x monthly.   PT DURATION: 12 weeks   PLANNED INTERVENTIONS (unless contraindicated): aquatic PT, Canalith repositioning, cryotherapy, Electrical stimulation, Iontophoresis with 4 mg/ml dexamethasome, Moist heat, traction, Ultrasound, gait training, Therapeutic exercise, balance training, neuromuscular re-education, patient/family education, prosthetic training, manual techniques, passive ROM, dry needling, taping, vasopnuematic device, vestibular, spinal manipulations, joint  manipulations   PLAN FOR NEXT SESSION: Consider continuing with biweekly with tapering to monthly over a longer period of 12 weeks;  DN with ES as needed, update HEP as needed for lumbo/pelvic/hip strengthening emphasize glute  med strengthening (add standing options)  Royal Hawthorn PT, DPT 04/12/22  11:51 AM

## 2022-04-20 IMAGING — MG DIGITAL SCREENING BILAT W/ TOMO W/ CAD
8 series · 8 of 24 positions shown · non-contrast
Comparison: Previous exam(s).

CLINICAL DATA: Screening.

EXAM:
DIGITAL SCREENING BILATERAL MAMMOGRAM WITH TOMO AND CAD

[L CC synth-2D]
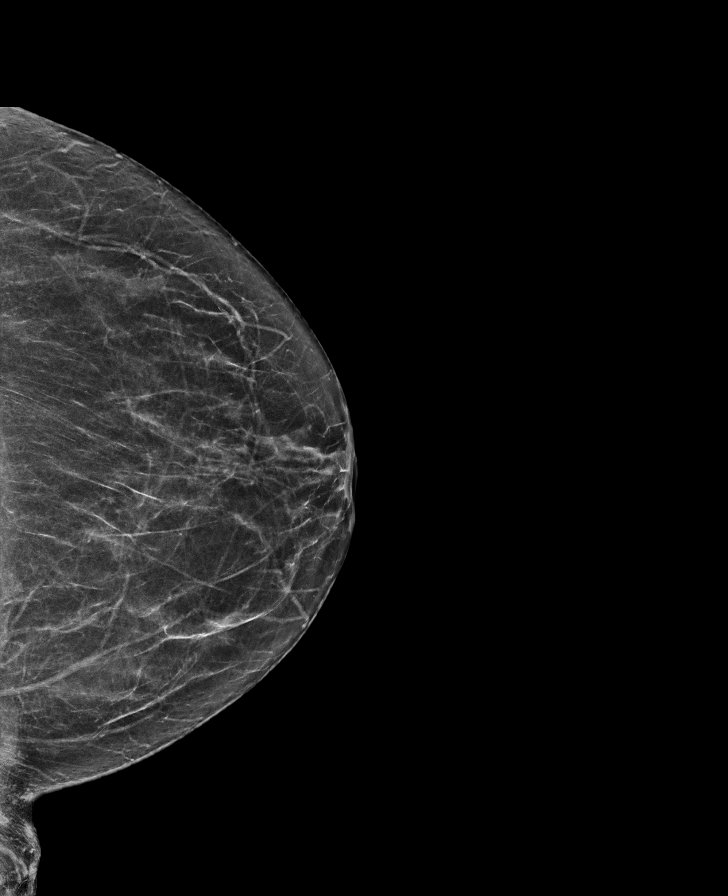

[R CC synth-2D]
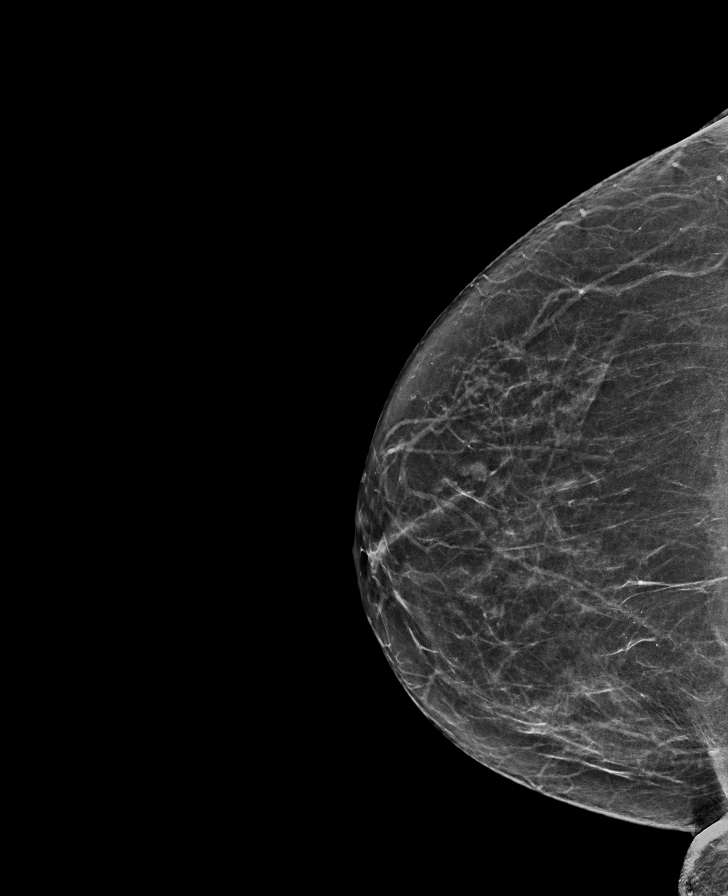

[L MLO synth-2D]
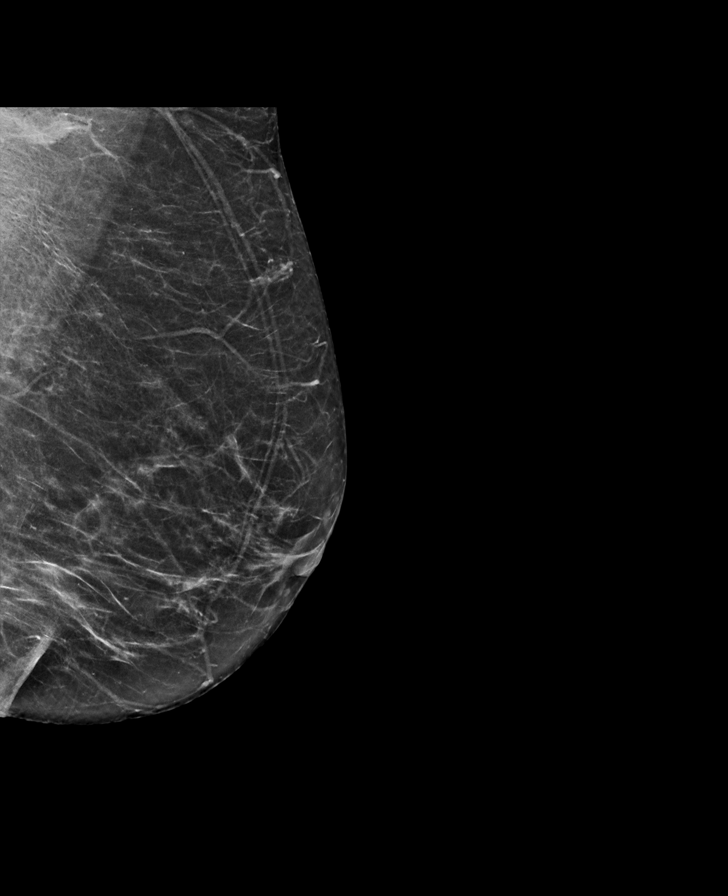

[R MLO synth-2D]
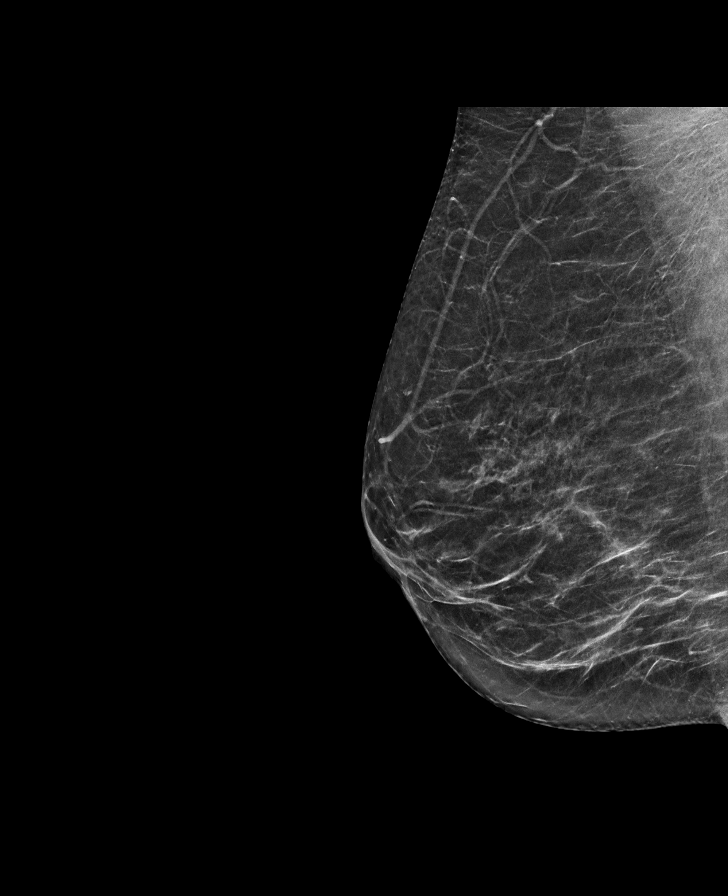

[R MLO tomo · tomo slice 31/61.0]
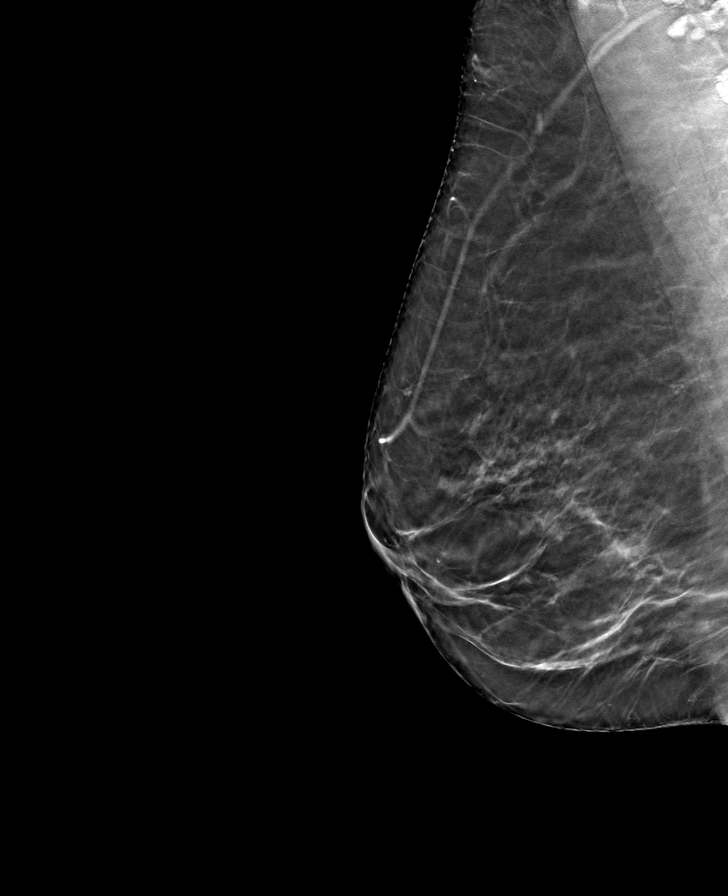

[R CC tomo · tomo slice 31/62.0]
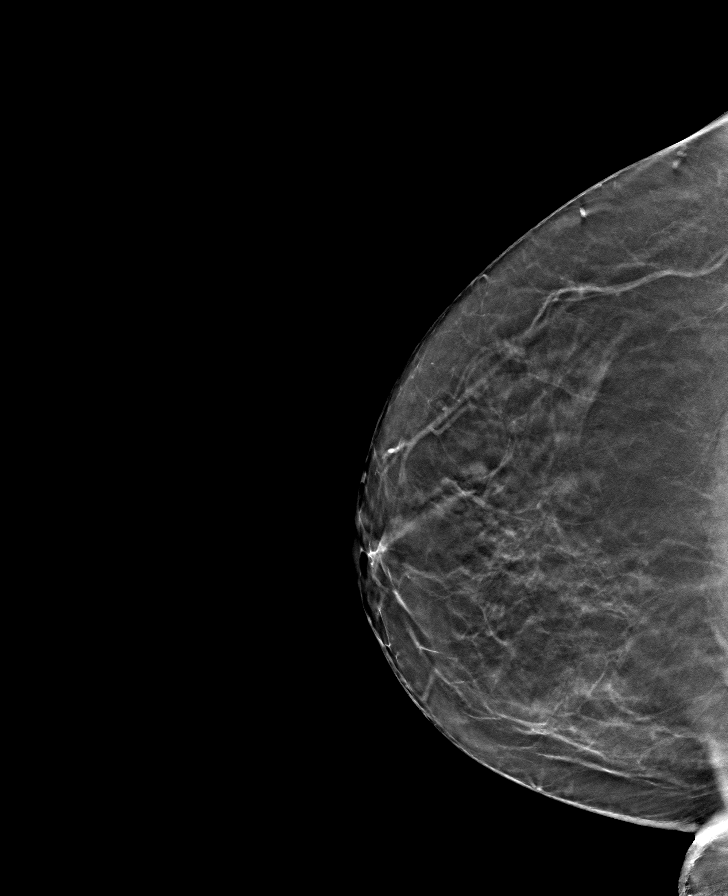

[L MLO tomo · tomo slice 31/61.0]
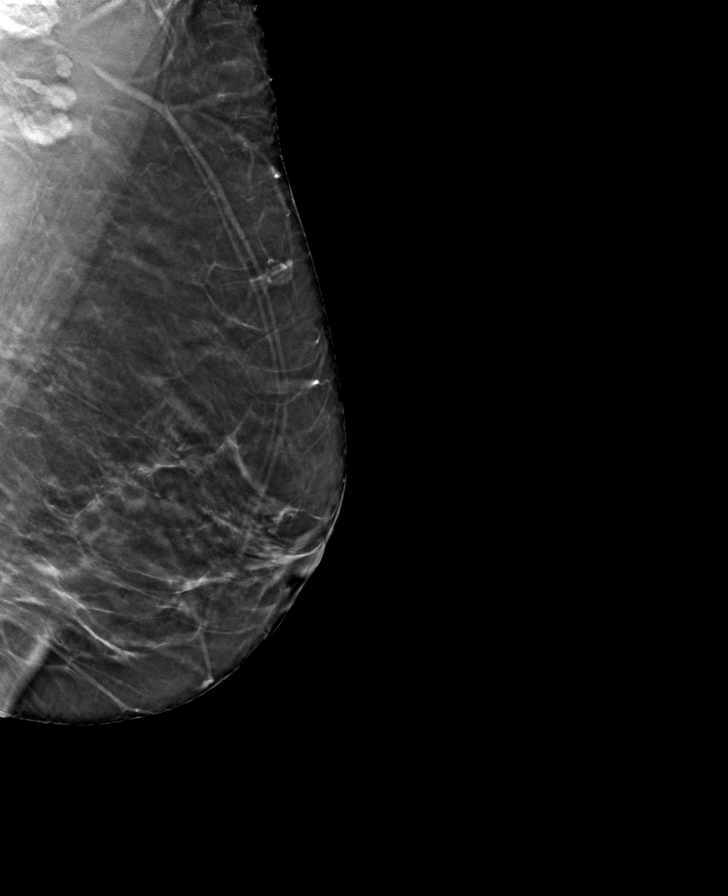

[L CC tomo · tomo slice 29/58.0]
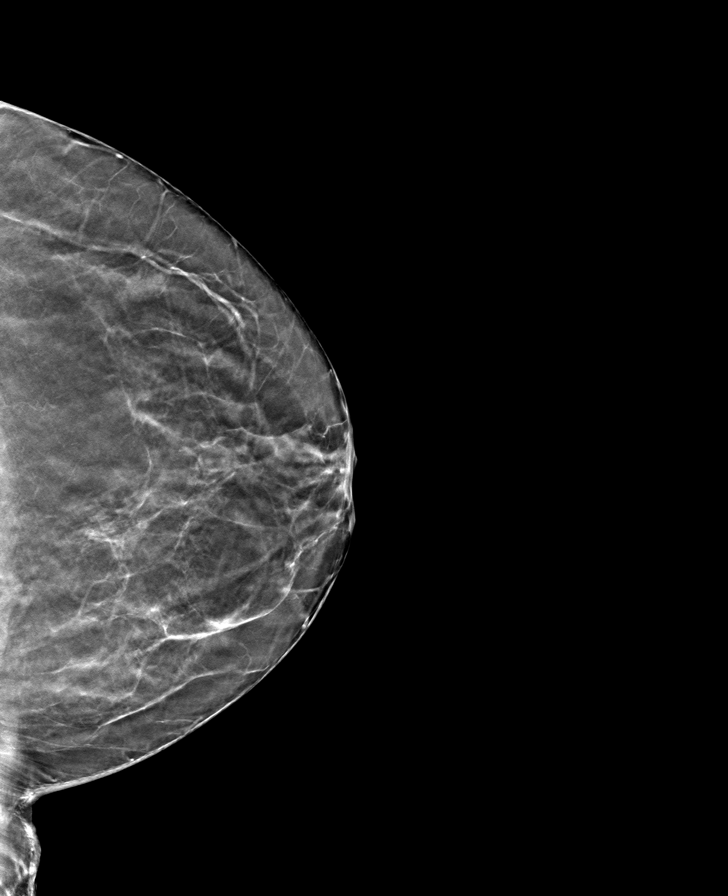

[8 of 24 positions shown; findings below may reference images not displayed]

ACR Breast Density Category b: There are scattered areas of
fibroglandular density.
FINDINGS: There are no findings suspicious for malignancy. Images were
processed with CAD.
IMPRESSION: No mammographic evidence of malignancy. A result letter of this
screening mammogram will be mailed directly to the patient.

RECOMMENDATION:
Screening mammogram in one year. (Code:[id number])

BI-RADS CATEGORY  1: Negative.

## 2022-04-26 NOTE — Progress Notes (Unsigned)
[facility] [place] Phone: [phone] Subjective:   [name], am serving as a scribe for Dr. Hulan Saas.  I'm seeing this patient by the request  of:  Wenda Low, MD  CC: si joint pain follow up   [id number]  02/24/2022 Continues to be difficult at this time.  Is responding well though to dry needling at the moment.  Discussed posture and ergonomics.  Discussed which activities to do and which ones to avoid.  Discussed tightness with the FABER test still.  We discussed other treatment options including potential injection which patient wants to hold at this moment.  Will follow-up again in 2 months to further evaluate      Update 04/27/2022 Joan Edwards is a 62 y.o. female coming in with complaint of SI jt dysfunction. Patient states continues to have some discomfort noted.  Dry needling and massage have been helpful but not seen the masseuse as regularly.  Last 3 nights has had some increasing in discomfort and did start with the gabapentin.       Past Medical History:  Diagnosis Date   BREAST MASS, LEFT 04/10/2007   Qualifier: Diagnosis of  By: Arnoldo Morale MD, Balinda Quails    Past Surgical History:  Procedure Laterality Date   BREAST CYST EXCISION     DILATION AND CURETTAGE OF UTERUS     tonsil     Social History   Socioeconomic History   Marital status: Married    Spouse name: Not on file   Number of children: Not on file   Years of education: Not on file   Highest education level: Not on file  Occupational History   Not on file  Tobacco Use   Smoking status: Former   Smokeless tobacco: Never   Tobacco comments:    light smoker for 15 years, quit at age 38  Vaping Use   Vaping Use: Never used  Substance and Sexual Activity   Alcohol use: Yes    Comment: occ, 1-2 glass of wine a few times per week   Drug use: Never   Sexual activity: Yes  Other Topics Concern   Not on file   Social History Narrative   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      Home Situation: lives with husband and daughter, son      Spiritual Beliefs: Christian      Lifestyle: yoga once per week - more in the summer, walks a few days per week; diet is fair      Social Determinants of Radio broadcast assistant Strain: Not on file  Food Insecurity: Not on file  Transportation Needs: Not on file  Physical Activity: Not on file  Stress: Not on file  Social Connections: Not on file   Allergies  Allergen Reactions   Erythromycin     REACTION: Upset GI   Other     Bananas-hives   Polymyxin B-Trimethoprim Itching   Sulfonamide Derivatives     REACTION: itching   Family History  Problem Relation Age of Onset   Hypertension Mother    Hypertension Father       Current Outpatient Medications (Respiratory):    loratadine (CLARITIN) 10 MG tablet, Take 10 mg by mouth daily.   pseudoephedrine (SUDAFED) 30 MG tablet, Take 30 mg by mouth every 4 (four) hours as needed for congestion.    Current Outpatient Medications (Other):    calcium  gluconate 500 MG tablet, Take 1 tablet by mouth daily.   cholecalciferol (VITAMIN D) 1000 UNITS tablet, Take 1,000 Units by mouth daily.   famciclovir (FAMVIR) 250 MG tablet, TAKE ONE TABLET BY MOUTH TWICE DAILY FOR 5 DAYS AS NEEDED   gabapentin (NEURONTIN) 100 MG capsule, Take 2 capsules (200 mg total) by mouth at bedtime.   glucosamine-chondroitin 500-400 MG tablet, Take 1 tablet by mouth daily.   MAGNESIUM PO, Take by mouth.   Multiple Vitamin (MULTIVITAMIN) capsule, Take 1 capsule by mouth daily.   Omega-3 Fatty Acids (FISH OIL PO), Take by mouth daily.   triamcinolone (KENALOG) 0.025 % ointment, APPLY TO AFFECTED AREA TWICE A DAY   TURMERIC PO, Take by mouth daily.   Vitamin D, Ergocalciferol, (DRISDOL) 1.25 MG (50000 UNIT) CAPS capsule, TAKE 1 CAPSULE (50,000 UNITS TOTAL) BY MOUTH EVERY 7 (SEVEN) DAYS.   Reviewed prior external  information including notes and imaging from  primary care provider As well as notes that were available from care everywhere and other healthcare systems.  Past medical history, social, surgical and family history all reviewed in electronic medical record.  No pertanent information unless stated regarding to the chief complaint.   Review of Systems:  No headache, visual changes, nausea, vomiting, diarrhea, constipation, dizziness, abdominal pain, skin rash, fevers, chills, night sweats, weight loss, swollen lymph nodes, body aches, joint swelling, chest pain, shortness of breath, mood changes. POSITIVE muscle aches  Objective  Blood pressure 116/78, pulse 87, height 5' 2"$  (1.575 m), weight 151 lb (68.5 kg), SpO2 97 %.   General: No apparent distress alert and oriented x3 mood and affect normal, dressed appropriately.  HEENT: Pupils equal, extraocular movements intact  Respiratory: Patient's speak in full sentences and does not appear short of breath  Cardiovascular: No lower extremity edema, non tender, no erythema  Low back exam does have some mild loss of lordosis.  Tightness noted in the paraspinal musculature in the sacroiliac joint.  Patient does have negative straight leg test though noted.    Impression and Recommendations:     The above documentation has been reviewed and is accurate and complete Lyndal Pulley, DO

## 2022-04-27 ENCOUNTER — Ambulatory Visit: Payer: Federal, State, Local not specified - PPO | Attending: Family Medicine | Admitting: Physical Therapy

## 2022-04-27 ENCOUNTER — Ambulatory Visit: Payer: Federal, State, Local not specified - PPO | Admitting: Family Medicine

## 2022-04-27 VITALS — BP 116/78 | HR 87 | Ht 62.0 in | Wt 151.0 lb

## 2022-04-27 DIAGNOSIS — M62830 Muscle spasm of back: Secondary | ICD-10-CM | POA: Diagnosis present

## 2022-04-27 DIAGNOSIS — M545 Low back pain, unspecified: Secondary | ICD-10-CM | POA: Insufficient documentation

## 2022-04-27 DIAGNOSIS — M6281 Muscle weakness (generalized): Secondary | ICD-10-CM | POA: Insufficient documentation

## 2022-04-27 DIAGNOSIS — G8929 Other chronic pain: Secondary | ICD-10-CM | POA: Diagnosis present

## 2022-04-27 DIAGNOSIS — M533 Sacrococcygeal disorders, not elsewhere classified: Secondary | ICD-10-CM

## 2022-04-27 NOTE — Patient Instructions (Signed)
See you again in 2 months

## 2022-04-27 NOTE — Assessment & Plan Note (Signed)
Patient still having some difficulty of the left sacroiliac joint.  Is making some progress.  Discussed with patient again and attempted some mild osteopathic manipulation.  Discussed which activities to do and which ones to avoid.  Increase activity slowly otherwise.  No change in medications but can take gabapentin 200 mg as needed.  Follow-up with me again in 6 to 8 weeks otherwise.

## 2022-04-27 NOTE — Therapy (Signed)
OUTPATIENT PHYSICAL THERAPY TREATMENT NOTE   Patient Name: Joan Edwards MRN: [id number] DOB:September 06, 1960, 62 y.o., female Today's Date: 04/27/2022  PCP: Wenda Low, MD  REFERRING PROVIDER: Lyndal Pulley, DO   END OF SESSION:   PT End of Session - 04/27/22 0936     Visit Number 12    Number of Visits 24    Date for PT Re-Evaluation 05/31/22    Authorization Type [employer] PPO    PT Start Time 0934    PT Stop Time 1015    PT Time Calculation (min) 41 min    Activity Tolerance Patient tolerated treatment well              Past Medical History:  Diagnosis Date   BREAST MASS, LEFT 04/10/2007   Qualifier: Diagnosis of  By: Arnoldo Morale MD, Balinda Quails    Past Surgical History:  Procedure Laterality Date   BREAST CYST EXCISION     DILATION AND CURETTAGE OF UTERUS     tonsil     Patient Active Problem List   Diagnosis Date Noted   Other bursal cyst, left hand 07/02/2020   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    PRECAUTIONS: None   SUBJECTIVE:                                                                                                                                                                                      SUBJECTIVE STATEMENT:   I'm scheduled for regular appts with my massage therapist.  I think I need more frequent treatment.  I'm hesistant to do cortisone injections b/c I have osteoporosis.  I think the DN 100% works and helps the week that I get it.  Comes back in left SIJ 1 week after PT.  Flares up with a car trip and in the  morning.  I spend more time stretching and doing core.   Eval: Pt reports to PT with an acute on chronic piriforms and SIJ pain. She states that she has been dealing with this for years, but had a flare up about 2 months ago. Pt states that she had PT in the past and felt that dry needling was very beneficial. She also reports getting a deep tissue massage twice a month which  has also really helped, along with yoga.     PAIN:  Are you having pain? Yes: NPRS scale: 0/10 Pain location: L side glute, lower back and hip region.; left hip flexor tightness Pain description: Nerve pain, achy.   Aggravating factors: Sitting Relieving factors: Walking, heat     OBJECTIVE:    DIAGNOSTIC FINDINGS: None    PATIENT SURVEYS:  FOTO 69.01%, 71% in 10 visits.    COGNITION: Overall cognitive status: Within functional limits for tasks assessed                                    SENSATION: WFL     POSTURE: No Significant postural limitations   PALPATION: Multifidi, QL   LOWER EXTREMITY ROM:   Active ROM Right eval Left eval  Hip flexion [facility]  Hip internal rotation [facility]  Hip external rotation [facility]  Knee flexion [facility] WFL  Knee extension WFL WFL   (Blank rows = not tested)   LOWER EXTREMITY MMT:   MMT Right eval Left eval 12/6 12/18  Hip flexion 4+ 4+ Left 4+ 5  Knee flexion 4+ 4- Left 4 5  Knee extension 4+ 4- Left 4 5   (Blank rows = not tested)   LOWER EXTREMITY SPECIAL TESTS:  Hip special tests: SI compression test: negative and SI distraction test: negative   GAIT: Distance walked: 77f  Assistive device utilized: None Level of assistance: Complete Independence Comments: No antalgic gait patterns noted.      TODAY'S TREATMENT:  2/13:   Demo of side plank (pt feels this might aggravate neck/shoulder so recommend standing instead) Wall clams green band doubled 10x Side step double green band with emphasis on hip hinge and keeping tension on the band throughout   Trigger Point Dry-Needling  Treatment instructions: Expect mild to moderate muscle soreness. S/S of pneumothorax if dry needled over a lung field, and to seek immediate medical attention should they occur. Patient verbalized understanding of these instructions and education. Patient Consent Given: Yes Education handout provided: previously given Muscles treated: bil lumbar multifidi prone;  left gluteals/piriformis QL and lumbar parspinals left threading in sidelying Electrical stimulation performed: yes bil lumbar multifidi  Parameters: 1.5 ma 30 pps 4 min to left gluteals and 4 min to bil lumbar multifidi Treatment response/outcome: improved soft tissue mobility  Manual therapy: soft tissue mobilization to lumbar paraspinals       03/30/2022: Trigger Point Dry-Needling  Treatment instructions: Expect mild to moderate muscle soreness. S/S of pneumothorax if dry needled over a lung field, and to seek immediate medical attention should they occur. Patient verbalized understanding of these instructions and education. Patient Consent Given: Yes Education handout provided: previously given Muscles treated: bil lumbar multifidi prone;  left gluteals/piriformis QL and lumbar parspinals left threading in sidelying Electrical stimulation performed: yes bil lumbar multifidi  Parameters: 1.8 ma 80 pps 8 min Treatment response/outcome: improved soft tissue mobility  Quadruped and standing "fire hydrants" to activate gluteals Manual therapy: soft tissue mobilization to lumbar paraspinals  03/18/2022: Trigger Point Dry-Needling  Treatment instructions: Expect mild to moderate muscle soreness. S/S of pneumothorax if dry needled over a lung field, and to seek immediate medical attention should they occur. Patient verbalized understanding of these instructions and education.  Patient Consent Given: Yes Education handout provided: previously given Muscles treated: bil lumbar multifidi prone;  left  gluteals/piriformis in sidelying Electrical stimulation performed:  Not today Parameters: Not today  Treatment response/outcome: improved soft tissue mobility   Manual to L piriformis following dry needling.  Prayer stretch 30 sec in R/L/ Center Passive stretching to L piriformis  03/01/2022:  Reassessed MMT, and reviewed goals.    Trigger Point Dry-Needling  Treatment instructions: Expect mild to moderate muscle soreness. S/S of pneumothorax if dry needled over a lung field, and to seek immediate medical attention should they occur. Patient verbalized understanding of these instructions and education.  Patient Consent Given: Yes Education handout provided: previously given Muscles treated: bil lumbar multifidi prone;  left gluteals/piriformis in sidelying Electrical stimulation performed: yes bil lumbar multifidi and left gluteals Parameters: 1.8 ma 80 pps 8 min Treatment response/outcome: improved soft tissue mobility   Manual to L piriformis following dry needling.  Prayer stretch 30 sec in R/L/ Center Reviewed bird dog with cues to level hips Reviewed Constance Haw  Discussed sleeping with pillow under pelvis  PATIENT EDUCATION:  Education details: Educated pt on anatomy and physiology of current symptoms, FOTO, diagnosis, prognosis, HEP,  and POC. Person educated: Patient Education method: Customer service manager Education comprehension: verbalized understanding and returned demonstration   HOME EXERCISE PROGRAM: Access Code: [email or web address] URL: [email or web address] Date: 03/30/2022 Prepared by: Ruben Im  Exercises - Bird Dog  - 1 x daily - 7 x weekly - 1 sets - 10 reps - Primal Push Up  - 1 x daily - 7 x weekly - 1 sets - 10 reps - Prone Hip Extension - Two Pillows  - 1 x daily - 7 x weekly - 1 sets - 10 reps - Quadruped Hip Abduction and External Rotation  - 1 x daily - 7 x weekly - 1 sets - 10 reps     ASSESSMENT:   CLINICAL IMPRESSION: The  patient benefits significantly from dry needling combined with ES and manual therapy to stimulate underlying myofascial trigger points and muscular tissue for management of neuromusculoskeletal pain and address movement impairments.  Changed ES parameters to lower frequency I in hopes of longer lasting benefit.  Patient highly compliant with stretching and core ex's, encouraged targeted strengthening to glute medius.    OBJECTIVE IMPAIRMENTS: decreased activity tolerance, difficulty walking, decreased balance, decreased endurance, decreased mobility, decreased ROM, decreased strength, impaired flexibility, impaired UE/LE use, postural dysfunction, and pain.   ACTIVITY LIMITATIONS: bending, lifting, carry, locomotion, cleaning, community activity, driving, and or occupation   PERSONAL FACTORS: Osteoperosis are also affecting patient's functional outcome.   REHAB POTENTIAL: Good   CLINICAL DECISION MAKING: Stable/uncomplicated   EVALUATION COMPLEXITY: Low       GOALS: Short term PT Goals Target date: 02/05/2022 Pt will be I and compliant with HEP. Baseline:  Goal status: goal met 11/28 Pt will decrease pain by 25% overall Baseline: Goal status: goal met 12/6   Long term PT goals Target date: 05/31/2022  Pt will improve ROM to [facility] to improve functional mobility Baseline: Goal status: MET 03/01/2022 Pt will improve  hip/knee strength to at least 5-/5 MMT to improve functional strength Baseline: Goal status: New Pt will improve FOTO to at least 71% functional to show improved function Baseline: Goal status: 71.72. MET 03/01/2022 Pt will reduce pain by overall 50% overall with usual activity Baseline: Goal status: MET 03/01/2022 Pt will reduce pain to overall less than 2-3/10 with usual activity and work activity. Baseline: Goal status: ONGOING 3/10 on average.  Pt will be able to sit for prolonged periods of time without complaints Baseline: Goal status: Pt  state states that  she can sit for 30 min prior to having onset of pain upon standing.        7.  Pt would like to decrease reliance on dry needling, with continued 3/10 pain.        Baseline:         Goal Status: New   PLAN: PT FREQUENCY: 1x bi- weekly, tapering to 1 x monthly.   PT DURATION: 12 weeks   PLANNED INTERVENTIONS (unless contraindicated): aquatic PT, Canalith repositioning, cryotherapy, Electrical stimulation, Iontophoresis with 4 mg/ml dexamethasome, Moist heat, traction, Ultrasound, gait training, Therapeutic exercise, balance training, neuromuscular re-education, patient/family education, prosthetic training, manual techniques, passive ROM, dry needling, taping, vasopnuematic device, vestibular, spinal manipulations, joint manipulations   PLAN FOR NEXT SESSION: Consider continuing with biweekly with tapering to monthly over a longer period of 12 weeks;  DN with ES at lower frequency;hip strengthening emphasize glute med strengthening (add standing options)  Ruben Im, PT 04/27/22 10:14 PM Phone: [phone] Fax: [phone]

## 2022-05-04 LAB — TSH: TSH: 4.81 (ref 0.41–5.90)

## 2022-05-04 LAB — LIPID PANEL
Cholesterol: 184 (ref 0–200)
HDL: 104 — AB (ref 35–70)
LDL Cholesterol: 88
Triglycerides: 55 (ref 40–160)

## 2022-05-11 ENCOUNTER — Ambulatory Visit: Payer: Federal, State, Local not specified - PPO | Admitting: Physical Therapy

## 2022-05-11 DIAGNOSIS — M6281 Muscle weakness (generalized): Secondary | ICD-10-CM

## 2022-05-11 DIAGNOSIS — G8929 Other chronic pain: Secondary | ICD-10-CM

## 2022-05-11 DIAGNOSIS — M62830 Muscle spasm of back: Secondary | ICD-10-CM

## 2022-05-11 NOTE — Therapy (Signed)
OUTPATIENT PHYSICAL THERAPY TREATMENT NOTE/DISCHARGE SUMMARY   Patient Name: Joan Edwards MRN: [id number] DOB:December 08, 1960, 62 y.o., female Today's Date: 05/11/2022  PCP: Wenda Low, MD  REFERRING PROVIDER: Lyndal Pulley, DO   END OF SESSION:   PT End of Session - 05/11/22 0933     Visit Number 13    Number of Visits 24    Date for PT Re-Evaluation 05/31/22    Authorization Type [employer] P[employer]    PT Start Time 0930    PT Stop Time 1015    PT Time Calculation (min) 45 min    Activity Tolerance Patient tolerated treatment well              Past Medical History:  Diagnosis Date   BREAST MASS, LEFT 04/10/2007   Qualifier: Diagnosis of  By: Arnoldo Morale MD, Balinda Quails    Past Surgical History:  Procedure Laterality Date   BREAST CYST EXCISION     DILATION AND CURETTAGE OF UTERUS     tonsil     Patient Active Problem List   Diagnosis Date Noted   Other bursal cyst, left hand 07/02/2020   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    PRECAUTIONS: None   SUBJECTIVE:                                                                                                                                                                                      SUBJECTIVE STATEMENT:   I think my back is the same.   I felt good after the DN with ES but there is always something there.   Interestingly when I saw Dr. Tamala Julian he didn't bring up cortisone and MRI.  Started a 3rd yoga class.  I do 30 min of stretching and  strengthening everyday.     PAIN:  Are you having pain? Yes: NPRS scale: 0/10 Pain location: L side glute, lower back and hip region.; left hip flexor tightness Pain description: Nerve pain, achy.   Aggravating factors: Sitting Relieving factors: Walking, heat     OBJECTIVE:    DIAGNOSTIC FINDINGS: None    PATIENT SURVEYS:  FOTO 69.01%, 71% in 10 visits.  2/27:  78%   COGNITION: Overall cognitive status:  Within functional limits for tasks assessed                                    SENSATION: WFL     POSTURE: No Significant postural limitations   PALPATION: Multifidi, QL   LOWER EXTREMITY ROM:   Active ROM Right eval Left eval  Hip flexion [facility]  Hip internal rotation [facility]  Hip external rotation [facility]  Knee flexion [facility]  Knee extension WFL WFL   (Blank rows = not tested)   LOWER EXTREMITY MMT:   MMT Right eval Left eval 12/6 12/18 2/27  Hip flexion 4+ 4+ Left 4+ 5 5/5  Knee flexion 4+ 4- Left 4 5 Right 5, left 5  Knee extension 4+ 4- Left 4 5 Right 5, left 4+   (Blank rows = not tested)   LOWER EXTREMITY SPECIAL TESTS:  Hip special tests: SI compression test: negative and SI distraction test: negative   GAIT: Distance walked: 28f  Assistive device utilized: None Level of assistance: Complete Independence Comments: No antalgic gait patterns noted.      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 S/S of pneumothorax if dry needled over a lung field, and to seek immediate medical attention should they occur. Patient verbalized understanding of these instructions and education. Patient Consent Given: Yes Education handout provided: previously given Muscles treated: bil lumbar multifidi prone;  left gluteals/piriformis QL in sidelying Electrical  stimulation performed: yes bil lumbar multifidi  Parameters: 1.5 ma 80 pps 8 min Treatment response/outcome: improved soft tissue mobility  Manual therapy: soft tissue mobilization to lumbar paraspinals       2/13:   Demo of side plank (pt feels this might aggravate neck/shoulder so recommend standing instead) Wall clams green band doubled 10x Side step double green band with emphasis on hip hinge and keeping tension on the band throughout  Trigger Point Dry-Needling  Treatment instructions: Expect mild to moderate muscle soreness. S/S of pneumothorax if dry needled over a lung field, and to seek immediate medical attention should they occur. Patient verbalized understanding of these instructions and education. Patient Consent Given: Yes Education handout provided: previously given Muscles treated: bil lumbar multifidi prone;  left gluteals/piriformis QL and lumbar parspinals left threading in sidelying Electrical stimulation performed: yes bil lumbar multifidi  Parameters: 1.5 ma 30 pps 4 min to left gluteals and 4 min to bil lumbar multifidi Treatment response/outcome: improved soft tissue mobility  Manual therapy: soft tissue mobilization to lumbar paraspinals       03/30/2022: Trigger Point Dry-Needling  Treatment instructions: Expect mild to moderate muscle soreness. S/S of pneumothorax if dry needled over a lung field, and to seek immediate medical attention should they occur. Patient verbalized understanding of these instructions and education. Patient Consent Given: Yes Education handout provided: previously given Muscles treated: bil lumbar multifidi prone;  left gluteals/piriformis QL and lumbar parspinals left threading in sidelying Electrical stimulation performed: yes bil lumbar multifidi  Parameters: 1.8 ma 80 pps 8 min Treatment response/outcome: improved soft tissue mobility  Quadruped and standing "fire hydrants" to activate gluteals Manual therapy: soft tissue  mobilization to lumbar paraspinals PATIENT EDUCATION:  Education details: Educated pt on anatomy and physiology of current symptoms, FOTO, diagnosis, prognosis, HEP,  and  POC. Person educated: Patient Education method: Explanation and Demonstration Education comprehension: verbalized understanding and returned demonstration   HOME EXERCISE PROGRAM: Access Code: [email or web address] URL: [email or web address] Date: 03/30/2022 Prepared by: Ruben Im  Exercises - Bird Dog  - 1 x daily - 7 x weekly - 1 sets - 10 reps - Primal Push Up  - 1 x daily - 7 x weekly - 1 sets - 10 reps - Prone Hip Extension - Two Pillows  - 1 x daily - 7 x weekly - 1 sets - 10 reps - Quadruped Hip Abduction and External Rotation  - 1 x daily - 7 x weekly - 1 sets - 10 reps     ASSESSMENT:   CLINICAL IMPRESSION: The patient is able to self manage at this point and is highly compliant with an ex program on daily basis.  She gets regular massages which helps to mitigate symptoms so that she can remain physically active.  She has responded well to DN combined with ES with notable improvements in decreased tender point size and number and improved soft tissue mobility.  She has met the majority of goals and recommend discharge from PT at this time.      OBJECTIVE IMPAIRMENTS: decreased activity tolerance, difficulty walking, decreased balance, decreased endurance, decreased mobility, decreased ROM, decreased strength, impaired flexibility, impaired UE/LE use, postural dysfunction, and pain.   ACTIVITY LIMITATIONS: bending, lifting, carry, locomotion, cleaning, community activity, driving, and or occupation   PERSONAL FACTORS: Osteoperosis are also affecting patient's functional outcome.   REHAB POTENTIAL: Good   CLINICAL DECISION MAKING: Stable/uncomplicated   EVALUATION COMPLEXITY: Low       GOALS: Short term PT Goals Target date: 02/05/2022 Pt will be I and compliant with HEP. Baseline:  Goal  status: goal met 11/28 Pt will decrease pain by 25% overall Baseline: Goal status: goal met 12/6   Long term PT goals Target date: 05/31/2022  Pt will improve ROM to [facility] to improve functional mobility Baseline: Goal status: MET 03/01/2022 Pt will improve  hip/knee strength to at least 5-/5 MMT to improve functional strength Baseline: Goal status: partially met Pt will improve FOTO to at least 71% functional to show improved function Baseline: Goal status: 71.72. MET 03/01/2022 Pt will reduce pain by overall 50% overall with usual activity Baseline: Goal status: MET 03/01/2022 Pt will reduce pain to overall less than 2-3/10 with usual activity and work activity. Baseline: Goal status: partially met 3/10 on average.  Pt will be able to sit for prolonged periods of time without complaints Baseline: Goal status: Pt state states that she can sit for 30 min prior to having onset of pain upon standing.        7.  Pt would like to decrease reliance on dry needling, with continued 3/10 pain.        Baseline:         Goal Status: met   PLAN: PT FREQUENCY: 1x bi- weekly, tapering to 1 x monthly.   PT DURATION: 12 weeks   PLANNED INTERVENTIONS (unless contraindicated): aquatic PT, Canalith repositioning, cryotherapy, Electrical stimulation, Iontophoresis with 4 mg/ml dexamethasome, Moist heat, traction, Ultrasound, gait training, Therapeutic exercise, balance training, neuromuscular re-education, patient/family education, prosthetic training, manual techniques, passive ROM, dry needling, taping, vasopnuematic device, vestibular, spinal manipulations, joint manipulations   PLAN FOR NEXT SESSION: Consider continuing with biweekly with tapering to monthly over a longer period of 12 weeks;  DN with ES at lower frequency;hip strengthening emphasize glute med strengthening (add standing  options)   PHYSICAL THERAPY DISCHARGE SUMMARY  Visits from Start of Care: 13  Current functional level  related to goals / functional outcomes: See clinical impressions above   Remaining deficits: As above   Education / Equipment: HEP   Patient agrees to discharge. Patient goals were met. Patient is being discharged due to meeting the stated rehab goals.  Ruben Im, PT 05/11/22 6:58 PM Phone: [phone] Fax: [phone]

## 2022-05-25 ENCOUNTER — Encounter: Payer: Federal, State, Local not specified - PPO | Admitting: Physical Therapy

## 2022-06-28 NOTE — Progress Notes (Deleted)
Joan Edwards [address] [place] [address] Phone: [phone] Subjective:    I'm seeing this patient by the request  of:  Joan Housekeeper, MD  CC: low back pain follow up   [id number]  04/27/2022 Patient still having some difficulty of the left sacroiliac joint.  Is making some progress.  Discussed with patient again and attempted some mild osteopathic manipulation.  Discussed which activities to do and which ones to avoid.  Increase activity slowly otherwise.  No change in medications but can take gabapentin 200 mg as needed.  Follow-up with me again in 6 to 8 weeks otherwise.      Update 06/29/2022 Joan Edwards is a 62 y.o. female coming in with complaint of SI jt dysfunction. Patient states      Past Medical History:  Diagnosis Date   BREAST MASS, LEFT 04/10/2007   Qualifier: Diagnosis of  By: Joan Sheehan MD, Joan Edwards    Past Surgical History:  Procedure Laterality Date   BREAST CYST EXCISION     DILATION AND CURETTAGE OF UTERUS     tonsil     Social History   Socioeconomic History   Marital status: Married    Spouse name: Not on file   Number of children: Not on file   Years of education: Not on file   Highest education level: Not on file  Occupational History   Not on file  Tobacco Use   Smoking status: Former   Smokeless tobacco: Never   Tobacco comments:    light smoker for 15 years, quit at age 40  Vaping Use   Vaping Use: Never used  Substance and Sexual Activity   Alcohol use: Yes    Comment: occ, 1-2 glass of wine a few times per week   Drug use: Never   Sexual activity: Yes  Other Topics Concern   Not on file  Social History Narrative   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      Home Situation: lives with husband and daughter, son      Spiritual Beliefs: Christian      Lifestyle: yoga once per week - more in the summer, walks a few days per week; diet is fair      Social Determinants of  Corporate investment banker Strain: Not on file  Food Insecurity: Not on file  Transportation Needs: Not on file  Physical Activity: Not on file  Stress: Not on file  Social Connections: Not on file   Allergies  Allergen Reactions   Erythromycin     REACTION: Upset GI   Other     Bananas-hives   Polymyxin B-Trimethoprim Itching   Sulfonamide Derivatives     REACTION: itching   Family History  Problem Relation Age of Onset   Hypertension Mother    Hypertension Father       Current Outpatient Medications (Respiratory):    loratadine (CLARITIN) 10 MG tablet, Take 10 mg by mouth daily.   pseudoephedrine (SUDAFED) 30 MG tablet, Take 30 mg by mouth every 4 (four) hours as needed for congestion.    Current Outpatient Medications (Other):    calcium gluconate 500 MG tablet, Take 1 tablet by mouth daily.   cholecalciferol (VITAMIN D) 1000 UNITS tablet, Take 1,000 Units by mouth daily.   famciclovir (FAMVIR) 250 MG tablet, TAKE ONE TABLET BY MOUTH TWICE DAILY FOR 5 DAYS AS NEEDED   gabapentin (NEURONTIN) 100 MG capsule, Take 2 capsules (  200 mg total) by mouth at bedtime.   glucosamine-chondroitin 500-400 MG tablet, Take 1 tablet by mouth daily.   MAGNESIUM PO, Take by mouth.   Multiple Vitamin (MULTIVITAMIN) capsule, Take 1 capsule by mouth daily.   Omega-3 Fatty Acids (FISH OIL PO), Take by mouth daily.   triamcinolone (KENALOG) 0.025 % ointment, APPLY TO AFFECTED AREA TWICE A DAY   TURMERIC PO, Take by mouth daily.   Vitamin D, Ergocalciferol, (DRISDOL) 1.25 MG (50000 UNIT) CAPS capsule, TAKE 1 CAPSULE (50,000 UNITS TOTAL) BY MOUTH EVERY 7 (SEVEN) DAYS.   Reviewed prior external information including notes and imaging from  primary care provider As well as notes that were available from care everywhere and other healthcare systems.  Past medical history, social, surgical and family history all reviewed in electronic medical record.  No pertanent information unless stated  regarding to the chief complaint.   Review of Systems:  No headache, visual changes, nausea, vomiting, diarrhea, constipation, dizziness, abdominal pain, skin rash, fevers, chills, night sweats, weight loss, swollen lymph nodes, body aches, joint swelling, chest pain, shortness of breath, mood changes. POSITIVE muscle aches  Objective  There were no vitals taken for this visit.   General: No apparent distress alert and oriented x3 mood and affect normal, dressed appropriately.  HEENT: Pupils equal, extraocular movements intact  Respiratory: Patient's speak in full sentences and does not appear short of breath  Cardiovascular: No lower extremity edema, non tender, no erythema  Low back does have TTP on the SIJ     Impression and Recommendations:    The above documentation has been reviewed and is accurate and complete Judi Saa, DO

## 2022-06-29 ENCOUNTER — Ambulatory Visit: Payer: Federal, State, Local not specified - PPO | Admitting: Family Medicine

## 2022-06-30 ENCOUNTER — Ambulatory Visit: Payer: Federal, State, Local not specified - PPO | Admitting: Family Medicine

## 2022-06-30 ENCOUNTER — Encounter: Payer: Self-pay | Admitting: Family Medicine

## 2022-06-30 VITALS — BP 128/84 | HR 82 | Wt 150.0 lb

## 2022-06-30 DIAGNOSIS — M9902 Segmental and somatic dysfunction of thoracic region: Secondary | ICD-10-CM

## 2022-06-30 DIAGNOSIS — M9904 Segmental and somatic dysfunction of sacral region: Secondary | ICD-10-CM

## 2022-06-30 DIAGNOSIS — M533 Sacrococcygeal disorders, not elsewhere classified: Secondary | ICD-10-CM | POA: Diagnosis not present

## 2022-06-30 DIAGNOSIS — M9903 Segmental and somatic dysfunction of lumbar region: Secondary | ICD-10-CM

## 2022-06-30 MED ORDER — TIZANIDINE HCL 4 MG PO TABS: 4.0000 mg | ORAL_TABLET | Freq: Every day | ORAL | 0 refills | Status: DC

## 2022-06-30 MED ORDER — PREDNISONE 20 MG PO TABS: 20.0000 mg | ORAL_TABLET | Freq: Every day | ORAL | 0 refills | Status: DC

## 2022-06-30 NOTE — Patient Instructions (Signed)
Prednisone  for 7 days Zanaflex  at night Have a great time in [place] See [place] in early June

## 2022-06-30 NOTE — Progress Notes (Signed)
[name] [address] [place] [address] Phone: [phone] Subjective:   Joan Edwards, am serving as a scribe for Dr. Antoine Primas.  I'm seeing this patient by the request  of:  Georgann Housekeeper, MD  CC: Low back pain  [id number]  04/27/2022 Patient still having some difficulty of the left sacroiliac joint.  Is making some progress.  Discussed with patient again and attempted some mild osteopathic manipulation.  Discussed which activities to do and which ones to avoid.  Increase activity slowly otherwise.  No change in medications but can take gabapentin 200 mg as needed.  Follow-up with me again in 6 to 8 weeks otherwise.      Update 06/30/2022 Joan Mcclafferty Edwards is a 62 y.o. female coming in with complaint of SI jt pain. Patient states last week she had increase in SI jt pain. Had massage and rolled out her hip flexer and in lower back. Pain has improved from last week. Dry needling has also been helpful.      Past Medical History:  Diagnosis Date   BREAST MASS, LEFT 04/10/2007   Qualifier: Diagnosis of  By: Lovell Sheehan MD, Balinda Quails    Past Surgical History:  Procedure Laterality Date   BREAST CYST EXCISION     DILATION AND CURETTAGE OF UTERUS     tonsil     Social History   Socioeconomic History   Marital status: Married    Spouse name: Not on file   Number of children: Not on file   Years of education: Not on file   Highest education level: Not on file  Occupational History   Not on file  Tobacco Use   Smoking status: Former   Smokeless tobacco: Never   Tobacco comments:    light smoker for 15 years, quit at age 62  Vaping Use   Vaping Use: Never used  Substance and Sexual Activity   Alcohol use: Yes    Comment: occ, 1-2 glass of wine a few times per week   Drug use: Never   Sexual activity: Yes  Other Topics Concern   Not on file  Social History Narrative   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       Home Situation: lives with husband and daughter, son      Spiritual Beliefs: Christian      Lifestyle: yoga once per week - more in the summer, walks a few days per week; diet is fair      Social Determinants of Corporate investment banker Strain: Not on file  Food Insecurity: Not on file  Transportation Needs: Not on file  Physical Activity: Not on file  Stress: Not on file  Social Connections: Not on file   Allergies  Allergen Reactions   Erythromycin     REACTION: Upset GI   Other     Bananas-hives   Polymyxin B-Trimethoprim Itching   Sulfonamide Derivatives     REACTION: itching   Family History  Problem Relation Age of Onset   Hypertension Mother    Hypertension Father     Current Outpatient Medications (Endocrine & Metabolic):    predniSONE (DELTASONE) 20 MG tablet, Take 1 tablet (20 mg total) by mouth daily with breakfast.   Current Outpatient Medications (Respiratory):    loratadine (CLARITIN) 10 MG tablet, Take 10 mg by mouth daily.   pseudoephedrine (SUDAFED) 30 MG tablet, Take 30 mg by mouth every 4 (four) hours as  needed for congestion.    Current Outpatient Medications (Other):    calcium gluconate 500 MG tablet, Take 1 tablet by mouth daily.   cholecalciferol (VITAMIN D) 1000 UNITS tablet, Take 1,000 Units by mouth daily.   famciclovir (FAMVIR) 250 MG tablet, TAKE ONE TABLET BY MOUTH TWICE DAILY FOR 5 DAYS AS NEEDED   gabapentin (NEURONTIN) 100 MG capsule, Take 2 capsules (200 mg total) by mouth at bedtime.   glucosamine-chondroitin 500-400 MG tablet, Take 1 tablet by mouth daily.   MAGNESIUM PO, Take by mouth.   Multiple Vitamin (MULTIVITAMIN) capsule, Take 1 capsule by mouth daily.   Omega-3 Fatty Acids (FISH OIL PO), Take by mouth daily.   tiZANidine (ZANAFLEX) 4 MG tablet, Take 1 tablet (4 mg total) by mouth at bedtime.   triamcinolone (KENALOG) 0.025 % ointment, APPLY TO AFFECTED AREA TWICE A DAY   TURMERIC PO, Take by mouth daily.   Vitamin D,  Ergocalciferol, (DRISDOL) 1.25 MG (50000 UNIT) CAPS capsule, TAKE 1 CAPSULE (50,000 UNITS TOTAL) BY MOUTH EVERY 7 (SEVEN) DAYS.   Reviewed prior external information including notes and imaging from  primary care provider As well as notes that were available from care everywhere and other healthcare systems.  Past medical history, social, surgical and family history all reviewed in electronic medical record.  No pertanent information unless stated regarding to the chief complaint.   Review of Systems:  No headache, visual changes, nausea, vomiting, diarrhea, constipation, dizziness, abdominal pain, skin rash, fevers, chills, night sweats, weight loss, swollen lymph nodes, body aches, joint swelling, chest pain, shortness of breath, mood changes. POSITIVE muscle aches  Objective  Blood pressure 128/84, pulse 82, weight 150 lb (68 kg), SpO2 99 %.   General: No apparent distress alert and oriented x3 mood and affect normal, dressed appropriately.  HEENT: Pupils equal, extraocular movements intact  Respiratory: Patient's speak in full sentences and does not appear short of breath  Cardiovascular: No lower extremity edema, non tender, no erythema  Low back does have some mild loss of lordosis.  Some tenderness to palpation in the paraspinal musculature.  Some tightness noted with FABER test right greater than left.  Osteopathic findings  T9 extended rotated and side bent left T11 extended rotated and side bent right L2 flexed rotated and side bent right L5 flexed rotated and side bent left Sacrum right on right       Impression and Recommendations:     The above documentation has been reviewed and is accurate and complete Judi Saa, DO

## 2022-06-30 NOTE — Assessment & Plan Note (Signed)
Exacerbation with some worsening symptoms with the sacroiliac joint.  Patient has been doing multiple different modalities.  Will be traveling.  Discussed with patient though about prednisone and Zanaflex.  Given prescription for so patient would have medications for her travels coming up.  Follow-up again in 6 to 8 weeks

## 2022-07-07 LAB — TSH: TSH: 5.71 (ref 0.41–5.90)

## 2022-07-13 ENCOUNTER — Encounter: Payer: Self-pay | Admitting: Rehabilitative and Restorative Service Providers"

## 2022-07-13 ENCOUNTER — Other Ambulatory Visit: Payer: Self-pay

## 2022-07-13 ENCOUNTER — Ambulatory Visit
Payer: Federal, State, Local not specified - PPO | Attending: Family Medicine | Admitting: Rehabilitative and Restorative Service Providers"

## 2022-07-13 DIAGNOSIS — M6281 Muscle weakness (generalized): Secondary | ICD-10-CM | POA: Insufficient documentation

## 2022-07-13 DIAGNOSIS — G8929 Other chronic pain: Secondary | ICD-10-CM | POA: Diagnosis present

## 2022-07-13 DIAGNOSIS — M533 Sacrococcygeal disorders, not elsewhere classified: Secondary | ICD-10-CM | POA: Insufficient documentation

## 2022-07-13 DIAGNOSIS — M62830 Muscle spasm of back: Secondary | ICD-10-CM

## 2022-07-13 DIAGNOSIS — M5459 Other low back pain: Secondary | ICD-10-CM | POA: Diagnosis present

## 2022-07-13 DIAGNOSIS — M545 Low back pain, unspecified: Secondary | ICD-10-CM

## 2022-07-13 NOTE — Therapy (Signed)
OUTPATIENT PHYSICAL THERAPY EVALUATION   Patient Name: Joan Edwards MRN: [id number] DOB:[birth year], 62 y.o., female Today's Date: 07/13/2022  END OF SESSION:  PT End of Session - 07/13/22 0809     Visit Number 1    Date for PT Re-Evaluation 09/03/22    Authorization Type BC/BS PPO    PT Start Time 0800    PT Stop Time 0840    PT Time Calculation (min) 40 min    Activity Tolerance Patient tolerated treatment well    Behavior During Therapy [facility] for tasks assessed/performed             Past Medical History:  Diagnosis Date   BREAST MASS, LEFT 04/10/2007   Qualifier: Diagnosis of  By: Lovell Sheehan MD, Balinda Quails    Past Surgical History:  Procedure Laterality Date   BREAST CYST EXCISION     DILATION AND CURETTAGE OF UTERUS     tonsil     Patient Active Problem List   Diagnosis Date Noted   Other bursal cyst, left hand 07/02/2020   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   Biceps tendonitis on right 09/13/2019   GERD 07/03/2007   IBS 07/03/2007   ALLERGIC RHINITIS 04/10/2007   Disorder of bone and cartilage 04/10/2007    PCP: Georgann Housekeeper, MD  REFERRING PROVIDER: Judi Saa, DO  REFERRING DIAG: M53.3 (ICD-10-CM) - SI (sacroiliac) joint dysfunction  Rationale for Evaluation and Treatment: Rehabilitation  THERAPY DIAG:  Muscle weakness (generalized) - Plan: PT plan of care cert/re-cert  Muscle spasm of back - Plan: PT plan of care cert/re-cert  Chronic left-sided low back pain without sciatica - Plan: PT plan of care cert/re-cert  Other low back pain - Plan: PT plan of care cert/re-cert  ONSET DATE: Recent discharge from PT on 05/11/2022  SUBJECTIVE:                                                                                                                                                                                           SUBJECTIVE STATEMENT: Patient reports that her pain has been essentially the same since her discharge from PT  on 05/11/2022.  However, states that there are things, such as yard work and increased driving that cause increased pain.  "I don't feel it all of the time."  Patient states that she is still getting routine massages and performing yoga.  PERTINENT HISTORY:  Osteoporosis, left breast mass excision in 2009  PAIN:  Are you having pain? Yes: NPRS scale: 3-6/10 Pain location: SI joint and left low back radiating down hip/psoas Pain description: radiating, sharp Aggravating factors: gardening, sitting in a  car/long-term sitting Relieving factors: massage, dry needling in the past  PRECAUTIONS: None  WEIGHT BEARING RESTRICTIONS: No  FALLS:  Has patient fallen in last 6 months? 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  NEXT MD VISIT: June 2024 with Dr Katrinka Blazing  OBJECTIVE:   DIAGNOSTIC FINDINGS:  No new imaging  PATIENT SURVEYS:  Eval:  FOTO 61% (projected 68% by visit 12)  SCREENING FOR RED FLAGS: Bowel or bladder incontinence: No Spinal tumors: No Cauda equina syndrome: No Compression fracture: No Abdominal aneurysm: No  COGNITION: Overall cognitive status: Within functional limits for tasks assessed     SENSATION: Sometimes reports a "nervy pain" sometimes down the left LE  MUSCLE LENGTH: Pt reports a tight psoas muscle  POSTURE: No Significant postural limitations  PALPATION: Tightness/spasms noted on left lumbar paraspinals  LUMBAR ROM:   WFL with some discomfort with end range motion  LOWER EXTREMITY ROM:     WFL  LOWER EXTREMITY MMT:    07/13/2022:  WFL except left  hip strength grossly 4 to 4+/5 throughout  LUMBAR SPECIAL TESTS:  Slump test: Negative  FUNCTIONAL TESTS:  07/13/2022: 5 times sit to stand: 6.07 sec Single leg stance: Right- 30 sec, Left- 3.08 sec   GAIT: Distance walked: >500 ft Assistive device utilized: None Level of assistance: Complete Independence Comments: Patient denies pain with ambulation "as long as I have the right shoes on".  Patient reports that if she is wearing non-supportive sandals, her back starts hurting after 10 minutes.  TODAY'S TREATMENT:                                                                                                                              DATE: 07/13/2022  Issued/Demonstrated/educated with HEP Discussed dry needling and possibility of adding in psoas muscle to dry needling   PATIENT EDUCATION:  Education details: Issued HEP and provided dry needling educated Person educated: Patient Education method: Explanation, Facilities manager, and Handouts Education comprehension: verbalized understanding  HOME EXERCISE PROGRAM: Access Code: [email or web address] URL: [email or web address] Date: 07/13/2022 Prepared by: Clydie Braun Sindhu Nguyen  Exercises - Bird Dog  - 1 x daily - 7 x weekly - 1 sets - 10 reps - Primal Push Up  - 1 x daily - 7 x weekly - 1 sets - 10 reps - Prone Hip Extension - Two Pillows  - 1 x daily - 7 x weekly - 1 sets - 10 reps - Quadruped Hip Abduction and External Rotation  - 1 x daily - 7 x weekly - 1 sets - 10 reps  ASSESSMENT:  CLINICAL IMPRESSION: Patient is  a 62 y.o. female who was seen today for physical therapy evaluation and treatment for SI joint dysfunction. Patient is known to this PT clinic from the past with recent discharge on 05/11/2022.  Patient states that she has multiple upcoming trips schedule and is hoping to feel better and be able to better maintain her pain levels during her travels.  Patient presents with increased pain, muscle spasms, decreased balance,  and pain with driving.  Patient would benefit from skilled PT to address her functional impairments to allow her to have decreased pain with functional tasks.  OBJECTIVE IMPAIRMENTS: decreased balance, difficulty walking, decreased strength, increased muscle spasms, impaired flexibility, and pain.   ACTIVITY LIMITATIONS: carrying, lifting, and sitting  PARTICIPATION LIMITATIONS: driving and community activity  PERSONAL FACTORS: Time since onset of injury/illness/exacerbation and 1 comorbidity: osteoporosis  are also affecting patient's functional outcome.   REHAB POTENTIAL: Good  CLINICAL DECISION MAKING: Stable/uncomplicated  EVALUATION COMPLEXITY: Low   GOALS: Goals reviewed with patient? Yes  SHORT TERM GOALS: Target date: 07/30/2022  Patient will be independent with initial HEP. Baseline: Goal status: INITIAL  2.  Patient will be able to perform left single leg stance for at least 7 seconds to decrease fall risk. Baseline: 3.08 sec Goal status: INITIAL   LONG TERM GOALS: Target date: 09/03/2022  Patient will be independent with advanced HEP. Baseline:  Goal status: INITIAL  2.  Patient will increase FOTO to at least 68% to demonstrate improvements in functional tasks. Baseline: 61% Goal status: INITIAL  3.  Patient will increase left hip strength to at least 5-/5 to allow her to navigate community environment with improved ease. Baseline:  Goal status: INITIAL  4.  Patient to report ability to sit for at least an hour with no increase in pain to allow her decreased pain with upcoming travels. Baseline:  Goal status: INITIAL  5.  Patient will increase left single leg stance to at least 20 seconds to demonstrate improved balance. Baseline: 3.08 sec Goal status: INITIAL   PLAN:  PT FREQUENCY: 1-2x/week  PT DURATION: 8 weeks  PLANNED INTERVENTIONS: Therapeutic exercises, Therapeutic activity, Neuromuscular re-education, Balance training, Gait training,  Patient/Family education, Self Care, Joint mobilization, Joint manipulation, Stair training, Aquatic Therapy, Dry Needling, Spinal manipulation, Spinal mobilization, Cryotherapy, Moist heat, Taping, Traction, Ultrasound, Ionotophoresis 4mg /ml Dexamethasone, Manual therapy, and Re-evaluation.  PLAN FOR NEXT SESSION: assess and progress HEP as indicated, flexibility, dry needling/manual therapy as indicated (possibly to include psoas muscle)   Justice Milliron, PT 07/13/2022, 10:05 AM

## 2022-07-13 NOTE — Patient Instructions (Signed)

## 2022-07-21 ENCOUNTER — Ambulatory Visit: Payer: Federal, State, Local not specified - PPO | Attending: Family Medicine | Admitting: Physical Therapy

## 2022-07-21 DIAGNOSIS — G8929 Other chronic pain: Secondary | ICD-10-CM | POA: Insufficient documentation

## 2022-07-21 DIAGNOSIS — M62830 Muscle spasm of back: Secondary | ICD-10-CM | POA: Insufficient documentation

## 2022-07-21 DIAGNOSIS — M545 Low back pain, unspecified: Secondary | ICD-10-CM | POA: Diagnosis present

## 2022-07-21 DIAGNOSIS — M6281 Muscle weakness (generalized): Secondary | ICD-10-CM | POA: Insufficient documentation

## 2022-07-21 NOTE — Therapy (Signed)
OUTPATIENT PHYSICAL THERAPY PROGRESS NOTE   Patient Name: Joan Edwards MRN: [id number] DOB:03-06-1961, 62 y.o., female Today's Date: 07/21/2022  END OF SESSION:  PT End of Session - 07/21/22 1146     Visit Number 2    Date for PT Re-Evaluation 09/03/22    Authorization Type BC/BS PPO    PT Start Time 1146    PT Stop Time 1229    PT Time Calculation (min) 43 min    Activity Tolerance Patient tolerated treatment well             Past Medical History:  Diagnosis Date   BREAST MASS, LEFT 04/10/2007   Qualifier: Diagnosis of  By: Lovell Sheehan MD, Balinda Quails    Past Surgical History:  Procedure Laterality Date   BREAST CYST EXCISION     DILATION AND CURETTAGE OF UTERUS     tonsil     Patient Active Problem List   Diagnosis Date Noted   Other bursal cyst, left hand 07/02/2020   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   Biceps tendonitis on right 09/13/2019   GERD 07/03/2007   IBS 07/03/2007   ALLERGIC RHINITIS 04/10/2007   Disorder of bone and cartilage 04/10/2007    PCP: Georgann Housekeeper, MD  REFERRING PROVIDER: Judi Saa, DO  REFERRING DIAG: M53.3 (ICD-10-CM) - SI (sacroiliac) joint dysfunction  Rationale for Evaluation and Treatment: Rehabilitation  THERAPY DIAG:  Muscle weakness (generalized)  Muscle spasm of back  Chronic left-sided low back pain without sciatica  ONSET DATE: Recent discharge from PT on 05/11/2022  SUBJECTIVE:                                                                                                                                                                                           SUBJECTIVE STATEMENT: Concerned about SI joint and left low back pain before 2 big trips. I do think there is more weakness on the left.  I notice it with going up steps.   PERTINENT HISTORY:  Osteoporosis, left breast mass excision in 2009  PAIN:  Are you having pain? Yes: NPRS scale: 6/10 Pain location: SI joint and left low back  radiating down hip/psoas Pain description: radiating, sharp Aggravating factors: gardening, sitting in a car/long-term sitting Relieving factors: massage, dry needling in the past  PRECAUTIONS: None  WEIGHT BEARING RESTRICTIONS: No  FALLS:  Has patient fallen in last 6 months? 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  NEXT MD VISIT: June 2024 with Dr Katrinka Blazing  OBJECTIVE:   DIAGNOSTIC FINDINGS:  No new imaging  PATIENT SURVEYS:  Eval:  FOTO 61% (projected 68% by visit 12)  SCREENING FOR RED FLAGS: Bowel or bladder incontinence: No Spinal tumors: No Cauda equina syndrome: No Compression fracture: No Abdominal aneurysm: No  COGNITION: Overall cognitive status: Within functional limits for tasks assessed     SENSATION: Sometimes reports a "nervy pain" sometimes down the left LE  MUSCLE LENGTH: Pt reports a tight psoas muscle  POSTURE: No Significant postural limitations  PALPATION: Tightness/spasms noted on left lumbar paraspinals  LUMBAR ROM:   WFL with some discomfort with end range motion  LOWER EXTREMITY ROM:     WFL  LOWER EXTREMITY MMT:    07/13/2022:  WFL except left hip strength grossly 4 to 4+/5 throughout  LUMBAR SPECIAL TESTS:  Slump test: Negative  FUNCTIONAL TESTS:  07/13/2022: 5 times sit to stand: 6.07 sec Single leg stance: Right- 30 sec, Left- 3.08 sec   GAIT: Distance walked: >500 ft Assistive device utilized: None Level of assistance: Complete Independence Comments: Patient denies pain with ambulation "as long as I have the right shoes on".   Patient reports that if she is wearing non-supportive sandals, her back starts hurting after 10 minutes.  TODAY'S TREATMENT:         DATE: 07/21/2022  Discussion of left sided step ups and single leg ex's for focused strengthening.  Trigger Point Dry-Needling  Treatment instructions: Expect mild to moderate muscle soreness. S/S of pneumothorax if dry needled over a lung field, and to seek immediate medical attention should they occur. Patient verbalized understanding of these instructions and education.  Patient Consent Given: Yes Education handout provided: Yes Muscles treated: left QL and gluteals in sidelying; prone bil lumbar multifidi Electrical stimulation performed: Yes Parameters:  1.5 ma 8 min  80pps Treatment response/outcome: improved soft tissue mobility; dec tender points 2 min moist heat in supine                                                                                                                          DATE: 07/13/2022  Issued/Demonstrated/educated with HEP Discussed dry needling and possibility of adding in psoas muscle to dry needling   PATIENT EDUCATION:  Education details: Issued HEP and provided dry needling educated Person educated: Patient Education method: Programmer, multimedia, Facilities manager, and Handouts Education comprehension: verbalized understanding  HOME EXERCISE PROGRAM: Access Code: [email or web address] URL: [email or web address] Date: 07/13/2022 Prepared by: Reather Laurence  Exercises - Bird Dog  - 1 x daily - 7 x weekly - 1 sets - 10 reps - Primal Push Up  - 1 x daily - 7 x weekly - 1 sets - 10 reps - Prone Hip Extension - Two Pillows  - 1  x daily - 7 x weekly - 1 sets - 10 reps - Quadruped Hip Abduction and External Rotation  - 1 x daily - 7 x weekly - 1 sets - 10 reps  ASSESSMENT:  CLINICAL IMPRESSION: Patient is highly compliant with an ex program but we discussed left sided focused strengthening particularly step ups and SLS to  prepare for upcoming travels/physical demands.  She is sensitive to DN particularly in lumbar multifidi but add ES to DN helps with pain control.   Decreased tender points noted in gluteals and improved QL length following session.  Therapist monitoring response to all interventions and modifying treatment accordingly.     OBJECTIVE IMPAIRMENTS: decreased balance, difficulty walking, decreased strength, increased muscle spasms, impaired flexibility, and pain.   ACTIVITY LIMITATIONS: carrying, lifting, and sitting  PARTICIPATION LIMITATIONS: driving and community activity  PERSONAL FACTORS: Time since onset of injury/illness/exacerbation and 1 comorbidity: osteoporosis  are also affecting patient's functional outcome.   REHAB POTENTIAL: Good  CLINICAL DECISION MAKING: Stable/uncomplicated  EVALUATION COMPLEXITY: Low   GOALS: Goals reviewed with patient? Yes  SHORT TERM GOALS: Target date: 07/30/2022  Patient will be independent with initial HEP. Baseline: Goal status: INITIAL  2.  Patient will be able to perform left single leg stance for at least 7 seconds to decrease fall risk. Baseline: 3.08 sec Goal status: INITIAL   LONG TERM GOALS: Target date: 09/03/2022  Patient will be independent with advanced HEP. Baseline:  Goal status: INITIAL  2.  Patient will increase FOTO to at least 68% to demonstrate improvements in functional tasks. Baseline: 61% Goal status: INITIAL  3.  Patient will increase left hip strength to at least 5-/5 to allow her to navigate community environment with improved ease. Baseline:  Goal status: INITIAL  4.  Patient to report ability to sit for at least an hour with no increase in pain to allow her decreased pain with upcoming travels. Baseline:  Goal status: INITIAL  5.  Patient will increase left single leg stance to at least 20 seconds to demonstrate improved balance. Baseline: 3.08 sec Goal status: INITIAL   PLAN:  PT FREQUENCY:  1-2x/week  PT DURATION: 8 weeks  PLANNED INTERVENTIONS: Therapeutic exercises, Therapeutic activity, Neuromuscular re-education, Balance training, Gait training, Patient/Family education, Self Care, Joint mobilization, Joint manipulation, Stair training, Aquatic Therapy, Dry Needling, Spinal manipulation, Spinal mobilization, Cryotherapy, Moist heat, Taping, Traction, Ultrasound, Ionotophoresis 4mg /ml Dexamethasone, Manual therapy, and Re-evaluation.  PLAN FOR NEXT SESSION:check regarding left LE strength with step ups and SLS, dry needling with ES/manual therapy as indicated; may add proximal quads to DN  Lavinia Sharps, PT 07/21/22 1:50 PM Phone: [phone] Fax: [phone]

## 2022-07-28 ENCOUNTER — Ambulatory Visit: Payer: Federal, State, Local not specified - PPO | Admitting: Physical Therapy

## 2022-07-28 DIAGNOSIS — M6281 Muscle weakness (generalized): Secondary | ICD-10-CM

## 2022-07-28 DIAGNOSIS — M62830 Muscle spasm of back: Secondary | ICD-10-CM

## 2022-07-28 NOTE — Therapy (Signed)
OUTPATIENT PHYSICAL THERAPY PROGRESS NOTE   Patient Name: Joan Edwards MRN: [id number] DOB:23-Sep-1960, 62 y.o., female Today's Date: 07/28/2022  END OF SESSION:  PT End of Session - 07/28/22 1233     Visit Number 3    Date for PT Re-Evaluation 09/03/22    Authorization Type BC/BS PPO    PT Start Time 1231    PT Stop Time 1315    PT Time Calculation (min) 44 min    Activity Tolerance Patient tolerated treatment well             Past Medical History:  Diagnosis Date   BREAST MASS, LEFT 04/10/2007   Qualifier: Diagnosis of  By: Lovell Sheehan MD, Balinda Quails    Past Surgical History:  Procedure Laterality Date   BREAST CYST EXCISION     DILATION AND CURETTAGE OF UTERUS     tonsil     Patient Active Problem List   Diagnosis Date Noted   Other bursal cyst, left hand 07/02/2020   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   Biceps tendonitis on right 09/13/2019   GERD 07/03/2007   IBS 07/03/2007   ALLERGIC RHINITIS 04/10/2007   Disorder of bone and cartilage 04/10/2007    PCP: Georgann Housekeeper, MD  REFERRING PROVIDER: Judi Saa, DO  REFERRING DIAG: M53.3 (ICD-10-CM) - SI (sacroiliac) joint dysfunction  Rationale for Evaluation and Treatment: Rehabilitation  THERAPY DIAG:  Muscle weakness (generalized)  Muscle spasm of back  ONSET DATE: Recent discharge from PT on 05/11/2022  SUBJECTIVE:                                                                                                                                                                                           SUBJECTIVE STATEMENT: After DN no weakness on the stairs.  Decreased knot in SI region.  Tighter on left lateral hip, SI region  PERTINENT HISTORY:  Osteoporosis, left breast mass excision in 2009  PAIN:  Are you having pain? Yes: NPRS scale: 1/10 Pain location: SI joint and left low back radiating down hip/psoas Pain description: radiating, sharp Aggravating factors: gardening,  sitting in a car/long-term sitting Relieving factors: massage, dry needling in the past  PRECAUTIONS: None  WEIGHT BEARING RESTRICTIONS: No  FALLS:  Has patient fallen in last 6 months? 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  NEXT MD VISIT: June 2024 with Dr Katrinka Blazing  OBJECTIVE:   DIAGNOSTIC FINDINGS:  No new imaging  PATIENT SURVEYS:  Eval:  FOTO 61% (projected 68% by visit 12)  SCREENING FOR RED FLAGS: Bowel or bladder incontinence: No Spinal tumors: No Cauda equina syndrome: No Compression fracture: No Abdominal aneurysm: No  COGNITION: Overall cognitive status: Within functional limits for tasks assessed     SENSATION: Sometimes reports a "nervy pain" sometimes down the left LE  MUSCLE LENGTH: Pt reports a tight psoas muscle  POSTURE: No Significant postural limitations  PALPATION: Tightness/spasms noted on left lumbar paraspinals  LUMBAR ROM:   WFL with some discomfort with end range motion  LOWER EXTREMITY ROM:     WFL  LOWER EXTREMITY MMT:    07/13/2022:  WFL except left hip strength grossly 4 to 4+/5 throughout  LUMBAR SPECIAL TESTS:  Slump test: Negative  FUNCTIONAL TESTS:  07/13/2022: 5 times sit to stand: 6.07 sec Single leg stance: Right- 30 sec, Left- 3.08 sec   GAIT: Distance walked: >500 ft Assistive device utilized: None Level of assistance: Complete Independence Comments: Patient denies pain with ambulation "as long as I have the right shoes on".  Patient reports that if she is wearing non-supportive sandals, her back starts hurting after 10  minutes.  TODAY'S TREATMENT:       DATE: 07/28/2022  Review of progress and response including improved motor function following last treatment session Discussion of response to yoga Trigger Point Dry-Needling  Treatment instructions: Expect mild to moderate muscle soreness. S/S of pneumothorax if dry needled over a lung field, and to seek immediate medical attention should they occur. Patient verbalized understanding of these instructions and education.  Patient Consent Given: Yes Education handout provided: Yes Muscles treated: left QL and gluteals in sidelying; prone bil lumbar multifidi Electrical stimulation performed: Yes Parameters: 1.5 ma 8 min  80pps Treatment response/outcome: improved soft tissue mobility; dec tender points 2 min moist heat in supine        DATE: 07/21/2022  Discussion of left sided step ups and single leg ex's for focused strengthening.  Trigger Point Dry-Needling  Treatment instructions: Expect mild to moderate muscle soreness. S/S of pneumothorax if dry needled over a lung field, and to seek immediate medical attention should they occur. Patient verbalized understanding of these instructions and education.  Patient Consent Given: Yes Education handout provided: Yes Muscles treated: left QL and gluteals in sidelying; prone bil lumbar multifidi Electrical stimulation performed: Yes Parameters:  1.5 ma 8 min  80pps Treatment response/outcome: improved soft tissue mobility; dec tender points 2 min moist heat in supine                                                                                                                          DATE: 07/13/2022  Issued/Demonstrated/educated with HEP Discussed dry needling and possibility of adding in psoas muscle to  dry needling   PATIENT EDUCATION:  Education details: Issued HEP and provided dry needling educated Person educated: Patient Education method: Explanation, Facilities manager, and Handouts Education  comprehension: verbalized understanding  HOME EXERCISE PROGRAM: Access Code: [email or web address] URL: [email or web address] Date: 07/13/2022 Prepared by: Clydie Braun Menke  Exercises - Bird Dog  - 1 x daily - 7 x weekly - 1 sets - 10 reps - Primal Push Up  - 1 x daily - 7 x weekly - 1 sets - 10 reps - Prone Hip Extension - Two Pillows  - 1 x daily - 7 x weekly - 1 sets - 10 reps - Quadruped Hip Abduction and External Rotation  - 1 x daily - 7 x weekly - 1 sets - 10 reps  ASSESSMENT:  CLINICAL IMPRESSION: Good release of tight QL and gluteals on the left side.  Although DN with ES was more intense today she immediately reports decreased pain at the conclusion of session.  Therapist monitoring response to all interventions and modifying treatment accordingly. She continues to be highly compliant with an ex program which improves long term outcomes.     OBJECTIVE IMPAIRMENTS: decreased balance, difficulty walking, decreased strength, increased muscle spasms, impaired flexibility, and pain.   ACTIVITY LIMITATIONS: carrying, lifting, and sitting  PARTICIPATION LIMITATIONS: driving and community activity  PERSONAL FACTORS: Time since onset of injury/illness/exacerbation and 1 comorbidity: osteoporosis  are also affecting patient's functional outcome.   REHAB POTENTIAL: Good  CLINICAL DECISION MAKING: Stable/uncomplicated  EVALUATION COMPLEXITY: Low   GOALS: Goals reviewed with patient? Yes  SHORT TERM GOALS: Target date: 07/30/2022  Patient will be independent with initial HEP. Baseline: Goal status: met 5/15  2.  Patient will be able to perform left single leg stance for at least 7 seconds to decrease fall risk. Baseline: 3.08 sec Goal status: ongoing   LONG TERM GOALS: Target date: 09/03/2022  Patient will be independent with advanced HEP. Baseline:  Goal status: INITIAL  2.  Patient will increase FOTO to at least 68% to demonstrate improvements in functional  tasks. Baseline: 61% Goal status: INITIAL  3.  Patient will increase left hip strength to at least 5-/5 to allow her to navigate community environment with improved ease. Baseline:  Goal status: INITIAL  4.  Patient to report ability to sit for at least an hour with no increase in pain to allow her decreased pain with upcoming travels. Baseline:  Goal status: INITIAL  5.  Patient will increase left single leg stance to at least 20 seconds to demonstrate improved balance. Baseline: 3.08 sec Goal status: INITIAL   PLAN:  PT FREQUENCY: 1-2x/week  PT DURATION: 8 weeks  PLANNED INTERVENTIONS: Therapeutic exercises, Therapeutic activity, Neuromuscular re-education, Balance training, Gait training, Patient/Family education, Self Care, Joint mobilization, Joint manipulation, Stair training, Aquatic Therapy, Dry Needling, Spinal manipulation, Spinal mobilization, Cryotherapy, Moist heat, Taping, Traction, Ultrasound, Ionotophoresis 4mg /ml Dexamethasone, Manual therapy, and Re-evaluation.  PLAN FOR NEXT SESSION:follow up after trip; check SLS time for STG; dry needling with ES/manual therapy as indicated; may add proximal quads to DN  Lavinia Sharps, PT 07/28/22 1:55 PM Phone: [phone] Fax: [phone]

## 2022-08-10 ENCOUNTER — Ambulatory Visit: Payer: Federal, State, Local not specified - PPO | Admitting: Physical Therapy

## 2022-08-10 DIAGNOSIS — M6281 Muscle weakness (generalized): Secondary | ICD-10-CM | POA: Diagnosis not present

## 2022-08-10 DIAGNOSIS — M62830 Muscle spasm of back: Secondary | ICD-10-CM

## 2022-08-10 NOTE — Therapy (Signed)
OUTPATIENT PHYSICAL THERAPY PROGRESS NOTE   Patient Name: Joan Edwards MRN: [id number] DOB:1960-07-01, 62 y.o., female Today's Date: 08/10/2022  END OF SESSION:  PT End of Session - 08/10/22 0931     Visit Number 4    Date for PT Re-Evaluation 09/03/22    Authorization Type BC/BS PPO    PT Start Time 0932    PT Stop Time 1012    PT Time Calculation (min) 40 min    Activity Tolerance Patient tolerated treatment well             Past Medical History:  Diagnosis Date   BREAST MASS, LEFT 04/10/2007   Qualifier: Diagnosis of  By: Lovell Sheehan MD, Balinda Quails    Past Surgical History:  Procedure Laterality Date   BREAST CYST EXCISION     DILATION AND CURETTAGE OF UTERUS     tonsil     Patient Active Problem List   Diagnosis Date Noted   Other bursal cyst, left hand 07/02/2020   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   Biceps tendonitis on right 09/13/2019   GERD 07/03/2007   IBS 07/03/2007   ALLERGIC RHINITIS 04/10/2007   Disorder of bone and cartilage 04/10/2007    PCP: Georgann Housekeeper, MD  REFERRING PROVIDER: Judi Saa, DO  REFERRING DIAG: M53.3 (ICD-10-CM) - SI (sacroiliac) joint dysfunction  Rationale for Evaluation and Treatment: Rehabilitation  THERAPY DIAG:  Muscle weakness (generalized)  Muscle spasm of back  ONSET DATE: Recent discharge from PT on 05/11/2022  SUBJECTIVE:                                                                                                                                                                                           SUBJECTIVE STATEMENT: My back did well on trip to [place].  Slept more on left side with it straight and that seemed to be good.  At home switched sides of the bed and that helped.   PERTINENT HISTORY:  Osteoporosis, left breast mass excision in 2009  PAIN:  Are you having pain? Yes: NPRS scale: 1/10 Pain location: SI joint and left low back radiating down hip/psoas Pain  description: radiating, sharp Aggravating factors: gardening, sitting in a car/long-term sitting Relieving factors: massage, dry needling in the past  PRECAUTIONS: None  WEIGHT BEARING RESTRICTIONS: No  FALLS:  Has patient fallen in last 6 months? 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  NEXT MD VISIT: June 2024 with Dr Katrinka Blazing  OBJECTIVE:   DIAGNOSTIC FINDINGS:  No new imaging  PATIENT SURVEYS:  Eval:  FOTO 61% (projected 68% by visit 12)  SCREENING FOR RED FLAGS: Bowel or bladder incontinence: No Spinal tumors: No Cauda equina syndrome: No Compression fracture: No Abdominal aneurysm: No  COGNITION: Overall cognitive status: Within functional limits for tasks assessed     SENSATION: Sometimes reports a "nervy pain" sometimes down the left LE  MUSCLE LENGTH: Pt reports a tight psoas muscle  POSTURE: No Significant postural limitations  PALPATION: Tightness/spasms noted on left lumbar paraspinals  LUMBAR ROM:   WFL with some discomfort with end range motion  LOWER EXTREMITY ROM:     WFL  LOWER EXTREMITY MMT:    07/13/2022:  WFL except left hip strength grossly 4 to 4+/5 throughout  LUMBAR SPECIAL TESTS:  Slump test: Negative  FUNCTIONAL TESTS:  07/13/2022: 5 times sit to stand: 6.07 sec Single leg stance: Right- 30 sec, Left- 3.08 sec   GAIT: Distance walked: >500 ft Assistive device utilized: None Level of assistance: Complete Independence Comments: Patient denies pain with ambulation "as long as I have the right shoes on".  Patient reports that if she is  wearing non-supportive sandals, her back starts hurting after 10 minutes.  TODAY'S TREATMENT:      DATE: 08/10/2022  Review of status during trip to [facility] Discussed symptom response to changing side of the bed she sleeps on Manual therapy: soft tissue mobilization to left QL, gluteals and bil lumbar paraspinals Trigger Point Dry-Needling  Treatment instructions: Expect mild to moderate muscle soreness. S/S of pneumothorax if dry needled over a lung field, and to seek immediate medical attention should they occur. Patient verbalized understanding of these instructions and education.  Patient Consent Given: Yes Education handout provided: Yes Muscles treated: left QL and gluteals in sidelying; prone bil lumbar multifidi Electrical stimulation performed: Yes Parameters: 1.5 ma 8 min  80pps Treatment response/outcome: improved soft tissue mobility; dec tender points 2 min moist heat in supine   DATE: 07/28/2022  Review of progress and response including improved motor function following last treatment session Discussion of response to yoga Trigger Point Dry-Needling  Treatment instructions: Expect mild to moderate muscle soreness. S/S of pneumothorax if dry needled over a lung field, and to seek immediate medical attention should they occur. Patient verbalized understanding of these instructions and education.  Patient Consent Given: Yes Education handout provided: Yes Muscles treated: left QL and gluteals in sidelying; prone bil lumbar multifidi Electrical stimulation performed: Yes Parameters: 1.5 ma 8 min  80pps Treatment response/outcome: improved soft tissue mobility; dec tender points 2 min moist heat in supine        DATE: 07/21/2022  Discussion of left sided step ups and single leg ex's for focused strengthening.  Trigger Point Dry-Needling  Treatment instructions: Expect mild to moderate muscle soreness. S/S of pneumothorax if dry needled over a lung field, and to seek  immediate medical attention should they occur. Patient verbalized understanding of these instructions and education.  Patient Consent Given: Yes Education handout provided: Yes Muscles treated: left QL and gluteals in sidelying; prone bil lumbar multifidi Electrical stimulation performed: Yes Parameters:  1.5 ma 8 min  80pps Treatment response/outcome: improved soft tissue mobility; dec tender points 2 min moist heat in supine  PATIENT EDUCATION:  Education details: Issued HEP and provided dry needling educated Person educated: Patient Education method: Programmer, multimedia, Facilities manager, and Handouts Education comprehension: verbalized understanding  HOME EXERCISE PROGRAM: Access Code: [email or web address] URL: [email or web address] Date: 07/13/2022 Prepared by: Clydie Braun Menke  Exercises - Bird Dog  - 1 x daily - 7 x weekly - 1 sets - 10 reps - Primal Push Up  - 1 x daily - 7 x weekly - 1 sets - 10 reps - Prone Hip Extension - Two Pillows  - 1 x daily - 7 x weekly - 1 sets - 10 reps - Quadruped Hip Abduction and External Rotation  - 1 x daily - 7 x weekly - 1 sets - 10 reps  ASSESSMENT:  CLINICAL IMPRESSION: Decreased overall tender points noted and less sensitivity.  Recent change of side of bed has improved sleep.  Functionally, she handled the long flight, riding in the car in addition to hiking very well.  Therapist monitoring response to all interventions and modifying treatment accordingly.     OBJECTIVE IMPAIRMENTS: decreased balance, difficulty walking, decreased strength, increased muscle spasms, impaired flexibility, and pain.   ACTIVITY LIMITATIONS: carrying, lifting, and sitting  PARTICIPATION LIMITATIONS: driving and community activity  PERSONAL FACTORS: Time since onset of injury/illness/exacerbation and 1 comorbidity: osteoporosis  are also affecting patient's functional outcome.   REHAB POTENTIAL: Good  CLINICAL DECISION MAKING:  Stable/uncomplicated  EVALUATION COMPLEXITY: Low   GOALS: Goals reviewed with patient? Yes  SHORT TERM GOALS: Target date: 07/30/2022  Patient will be independent with initial HEP. Baseline: Goal status: met 5/15  2.  Patient will be able to perform left single leg stance for at least 7 seconds to decrease fall risk. Baseline: 3.08 sec Goal status: ongoing   LONG TERM GOALS: Target date: 09/03/2022  Patient will be independent with advanced HEP. Baseline:  Goal status: INITIAL  2.  Patient will increase FOTO to at least 68% to demonstrate improvements in functional tasks. Baseline: 61% Goal status: INITIAL  3.  Patient will increase left hip strength to at least 5-/5 to allow her to navigate community environment with improved ease. Baseline:  Goal status: INITIAL  4.  Patient to report ability to sit for at least an hour with no increase in pain to allow her decreased pain with upcoming travels. Baseline:  Goal status: INITIAL  5.  Patient will increase left single leg stance to at least 20 seconds to demonstrate improved balance. Baseline: 3.08 sec Goal status: INITIAL   PLAN:  PT FREQUENCY: 1-2x/week  PT DURATION: 8 weeks  PLANNED INTERVENTIONS: Therapeutic exercises, Therapeutic activity, Neuromuscular re-education, Balance training, Gait training, Patient/Family education, Self Care, Joint mobilization, Joint manipulation, Stair training, Aquatic Therapy, Dry Needling, Spinal manipulation, Spinal mobilization, Cryotherapy, Moist heat, Taping, Traction, Ultrasound, Ionotophoresis 4mg /ml Dexamethasone, Manual therapy, and Re-evaluation.  PLAN FOR NEXT SESSION: check SLS time for STG; dry needling with ES/manual therapy as indicated; may add proximal quads to DN  Lavinia Sharps, PT 08/10/22 8:08 PM Phone: [phone] Fax: [phone]

## 2022-08-12 NOTE — Progress Notes (Signed)
[name] [address] [place] [address] Phone: [phone] Subjective:   Bruce Donath, am serving as a scribe for Dr. Antoine Primas.  I'm seeing this patient by the request  of:  Georgann Housekeeper, MD  CC: Back and neck pain follow-up  [id number]  Joan Edwards is a 62 y.o. female coming in with complaint of back and neck pain. OMT on 06/30/2022. Patient states that she always has pain but her pain is at a more manageable level.   Medications patient has been prescribed: zanaflex prednisone  Taking: Only Zanaflex     X-rays have shown the patient does have facet arthropathy of both the lumbar and cervical areas.  These were in 2021 and 2022    Reviewed prior external information including notes and imaging from previsou exam, outside providers and external EMR if available.   As well as notes that were available from care everywhere and other healthcare systems.  Past medical history, social, surgical and family history all reviewed in electronic medical record.  No pertanent information unless stated regarding to the chief complaint.   Past Medical History:  Diagnosis Date   BREAST MASS, LEFT 04/10/2007   Qualifier: Diagnosis of  By: Lovell Sheehan MD, Balinda Quails     Allergies  Allergen Reactions   Erythromycin     REACTION: Upset GI   Other     Bananas-hives   Polymyxin B-Trimethoprim Itching   Sulfonamide Derivatives     REACTION: itching     Review of Systems:  No headache, visual changes, nausea, vomiting, diarrhea, constipation, dizziness, abdominal pain, skin rash, fevers, chills, night sweats, weight loss, swollen lymph nodes, body aches, joint swelling, chest pain, shortness of breath, mood changes. POSITIVE muscle aches  Objective  Blood pressure 110/76, pulse 76, height 5\' 2"  (1.575 m), weight 148 lb (67.1 kg), SpO2 98 %.   General: No apparent distress alert and oriented x3 mood and affect normal, dressed  appropriately.  HEENT: Pupils equal, extraocular movements intact  Respiratory: Patient's speak in full sentences and does not appear short of breath  Cardiovascular: No lower extremity edema, non tender, no erythema  Low back exam does have some loss lordosis noted.  Tightness with FABER test right greater than left.  Osteopathic findings  C2 flexed rotated and side bent right C5 flexed rotated and side bent left T3 extended rotated and side bent right inhaled rib L3 flexed rotated and side bent right Sacrum right on right       Assessment and Plan:  SI (sacroiliac) joint dysfunction Sacroiliac dysfunction noted.  Discussed which activities to do and which ones to avoid.  Increase activity slowly over the course of next several weeks.  Patient will be traveling and I do think patient will do well.  Has medications for any breakthrough pain.  Follow-up again in 6 to 8 weeks    Nonallopathic problems  Decision today to treat with OMT was based on Physical Exam  After verbal consent patient was treated with HVLA, ME, FPR techniques in cervical, rib, thoracic, lumbar, and sacral  areas  Patient tolerated the procedure well with improvement in symptoms  Patient given exercises, stretches and lifestyle modifications  See medications in patient instructions if given  Patient will follow up in 4-8 weeks     The above documentation has been reviewed and is accurate and complete Judi Saa, DO         Note: This dictation was prepared  with Dragon dictation along with smaller phrase technology. Any transcriptional errors that result from this process are unintentional.

## 2022-08-13 ENCOUNTER — Encounter: Payer: Federal, State, Local not specified - PPO | Admitting: Physical Therapy

## 2022-08-16 ENCOUNTER — Ambulatory Visit: Payer: Federal, State, Local not specified - PPO | Admitting: Family Medicine

## 2022-08-16 VITALS — BP 110/76 | HR 76 | Ht 62.0 in | Wt 148.0 lb

## 2022-08-16 DIAGNOSIS — M9908 Segmental and somatic dysfunction of rib cage: Secondary | ICD-10-CM

## 2022-08-16 DIAGNOSIS — M533 Sacrococcygeal disorders, not elsewhere classified: Secondary | ICD-10-CM

## 2022-08-16 DIAGNOSIS — M9901 Segmental and somatic dysfunction of cervical region: Secondary | ICD-10-CM | POA: Diagnosis not present

## 2022-08-16 DIAGNOSIS — M9902 Segmental and somatic dysfunction of thoracic region: Secondary | ICD-10-CM

## 2022-08-16 DIAGNOSIS — M9903 Segmental and somatic dysfunction of lumbar region: Secondary | ICD-10-CM | POA: Diagnosis not present

## 2022-08-16 DIAGNOSIS — M9904 Segmental and somatic dysfunction of sacral region: Secondary | ICD-10-CM

## 2022-08-16 NOTE — Assessment & Plan Note (Signed)
Sacroiliac dysfunction noted.  Discussed which activities to do and which ones to avoid.  Increase activity slowly over the course of next several weeks.  Patient will be traveling and I do think patient will do well.  Has medications for any breakthrough pain.  Follow-up again in 6 to 8 weeks

## 2022-08-16 NOTE — Patient Instructions (Signed)
Have a great time See me in 7-8 weeks

## 2022-08-17 ENCOUNTER — Ambulatory Visit: Payer: Federal, State, Local not specified - PPO | Attending: Family Medicine | Admitting: Physical Therapy

## 2022-08-17 DIAGNOSIS — M6281 Muscle weakness (generalized): Secondary | ICD-10-CM | POA: Diagnosis present

## 2022-08-17 DIAGNOSIS — M62830 Muscle spasm of back: Secondary | ICD-10-CM | POA: Insufficient documentation

## 2022-08-17 NOTE — Therapy (Signed)
OUTPATIENT PHYSICAL THERAPY PROGRESS NOTE   Patient Name: Joan Edwards MRN: [id number] DOB:01/13/1961, 62 y.o., female Today's Date: 08/17/2022  END OF SESSION:  PT End of Session - 08/17/22 0846     Visit Number 5    Date for PT Re-Evaluation 09/03/22    Authorization Type BC/BS PPO    PT Start Time 0846    PT Stop Time 0928    PT Time Calculation (min) 42 min    Activity Tolerance Patient tolerated treatment well             Past Medical History:  Diagnosis Date   BREAST MASS, LEFT 04/10/2007   Qualifier: Diagnosis of  By: Lovell Sheehan MD, Balinda Quails    Past Surgical History:  Procedure Laterality Date   BREAST CYST EXCISION     DILATION AND CURETTAGE OF UTERUS     tonsil     Patient Active Problem List   Diagnosis Date Noted   Other bursal cyst, left hand 07/02/2020   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   Biceps tendonitis on right 09/13/2019   GERD 07/03/2007   IBS 07/03/2007   ALLERGIC RHINITIS 04/10/2007   Disorder of bone and cartilage 04/10/2007    PCP: Georgann Housekeeper, MD  REFERRING PROVIDER: Judi Saa, DO  REFERRING DIAG: M53.3 (ICD-10-CM) - SI (sacroiliac) joint dysfunction  Rationale for Evaluation and Treatment: Rehabilitation  THERAPY DIAG:  Muscle weakness (generalized)  Muscle spasm of back  ONSET DATE: Recent discharge from PT on 05/11/2022  SUBJECTIVE:                                                                                                                                                                                           SUBJECTIVE STATEMENT: A little achey.  Saw Dr. Katrinka Blazing yesterday and will follow up 7/23.    PERTINENT HISTORY:  Osteoporosis, left breast mass excision in 2009  PAIN:  Are you having pain? Yes: NPRS scale: 1/10 Pain location: SI joint and left low back radiating down hip/psoas Pain description: radiating, sharp Aggravating factors: gardening, sitting in a car/long-term  sitting Relieving factors: massage, dry needling in the past  PRECAUTIONS: None  WEIGHT BEARING RESTRICTIONS: No  FALLS:  Has patient fallen in last 6 months? 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  NEXT MD VISIT: Dr. Katrinka Blazing yesterday and will follow up 7/23.  OBJECTIVE:   DIAGNOSTIC FINDINGS:  No new imaging  PATIENT SURVEYS:  Eval:  FOTO 61% (projected 68% by visit 12)  SCREENING FOR RED FLAGS: Bowel or bladder incontinence: No Spinal tumors: No Cauda equina syndrome: No Compression fracture: No Abdominal aneurysm: No  COGNITION: Overall cognitive status: Within functional limits for tasks assessed     SENSATION: Sometimes reports a "nervy pain" sometimes down the left LE  MUSCLE LENGTH: Pt reports a tight psoas muscle  POSTURE: No Significant postural limitations  PALPATION: Tightness/spasms noted on left lumbar paraspinals  LUMBAR ROM:   WFL with some discomfort with end range motion  LOWER EXTREMITY ROM:     WFL  LOWER EXTREMITY MMT:    07/13/2022:  WFL except left hip strength grossly 4 to 4+/5 throughout  LUMBAR SPECIAL TESTS:  Slump test: Negative  FUNCTIONAL TESTS:  07/13/2022: 5 times sit to stand: 6.07 sec Single leg stance: Right- 30 sec, Left- 3.08 sec  6/4:  left 20 sec with lateral trunk lean  GAIT: Distance walked: >500 ft Assistive device utilized: None Level of assistance: Complete Independence Comments: Patient denies pain with ambulation "as long as I have the right shoes on".  Patient reports that if she is wearing non-supportive  sandals, her back starts hurting after 10 minutes.  TODAY'S TREATMENT:      DATE: 08/17/2022  SLS Right/left Discussion of glute isometrics during the day for glute activation Discussion of exercises that target the gluteals Review of DEXA scan and improvements in BMD since on Fosamax, osteopenia in left femur and lumbar spine Manual therapy: soft tissue mobilization to left QL, gluteals and bil lumbar paraspinals Trigger Point Dry-Needling  Treatment instructions: Expect mild to moderate muscle soreness. S/S of pneumothorax if dry needled over a lung field, and to seek immediate medical attention should they occur. Patient verbalized understanding of these instructions and education.  Patient Consent Given: Yes Education handout provided: Yes Muscles treated: left QL and gluteals in sidelying; prone bil lumbar multifidi Electrical stimulation performed: Yes Parameters: 1.5 ma 5 min  80pps Treatment response/outcome: improved soft tissue mobility; dec tender points 2 min moist heat in supine     DATE: 08/10/2022  Review of status during trip to [facility] Discussed symptom response to changing side of the bed she sleeps on Manual therapy: soft tissue mobilization to left QL, gluteals and bil lumbar paraspinals Trigger Point Dry-Needling  Treatment instructions: Expect mild to moderate muscle soreness. S/S of pneumothorax if dry needled over a lung field, and to seek immediate medical attention should they occur. Patient verbalized understanding of these instructions and education.  Patient Consent Given: Yes Education handout provided: Yes Muscles treated: left QL and gluteals in sidelying; prone bil lumbar multifidi Electrical stimulation performed: Yes Parameters: 1.5 ma 8 min  80pps Treatment response/outcome: improved soft tissue mobility; dec tender points 2 min moist heat in supine   DATE: 07/28/2022  Review of progress and response including improved motor function following  last treatment session Discussion of response to yoga Trigger Point Dry-Needling  Treatment instructions: Expect mild to moderate muscle soreness. S/S of pneumothorax if dry needled over a lung field, and to seek immediate medical attention should they occur. Patient verbalized understanding of these instructions and education.  Patient Consent Given: Yes Education handout provided: Yes Muscles treated: left QL and gluteals in sidelying; prone bil lumbar multifidi Electrical stimulation performed:  Yes Parameters: 1.5 ma 8 min  80pps Treatment response/outcome: improved soft tissue mobility; dec tender points 2 min moist heat in supine      PATIENT EDUCATION:  Education details: Issued HEP and provided dry needling educated Person educated: Patient Education method: Programmer, multimedia, Facilities manager, and Handouts Education comprehension: verbalized understanding  HOME EXERCISE PROGRAM: Access Code: [email or web address] URL: [email or web address] Date: 07/13/2022 Prepared by: Clydie Braun Menke  Exercises - Bird Dog  - 1 x daily - 7 x weekly - 1 sets - 10 reps - Primal Push Up  - 1 x daily - 7 x weekly - 1 sets - 10 reps - Prone Hip Extension - Two Pillows  - 1 x daily - 7 x weekly - 1 sets - 10 reps - Quadruped Hip Abduction and External Rotation  - 1 x daily - 7 x weekly - 1 sets - 10 reps  ASSESSMENT:  CLINICAL IMPRESSION: Improved SLS time on left but with mild lateral trunk lean and pelvic drop (compensatory strategies for glute medius muscle weakness).  She's finding it's hard to get all her exercises in during the week so we discussed isometric variations in the seated position while working.  Some sensitivity with DN but has a positive overall response.  Therapist monitoring response to all interventions and modifying treatment accordingly.      OBJECTIVE IMPAIRMENTS: decreased balance, difficulty walking, decreased strength, increased muscle spasms, impaired flexibility, and  pain.   ACTIVITY LIMITATIONS: carrying, lifting, and sitting  PARTICIPATION LIMITATIONS: driving and community activity  PERSONAL FACTORS: Time since onset of injury/illness/exacerbation and 1 comorbidity: osteoporosis  are also affecting patient's functional outcome.   REHAB POTENTIAL: Good  CLINICAL DECISION MAKING: Stable/uncomplicated  EVALUATION COMPLEXITY: Low   GOALS: Goals reviewed with patient? Yes  SHORT TERM GOALS: Target date: 07/30/2022  Patient will be independent with initial HEP. Baseline: Goal status: met 5/15  2.  Patient will be able to perform left single leg stance for at least 7 seconds to decrease fall risk. Baseline: 3.08 sec Goal status: ongoing   LONG TERM GOALS: Target date: 09/03/2022  Patient will be independent with advanced HEP. Baseline:  Goal status: INITIAL  2.  Patient will increase FOTO to at least 68% to demonstrate improvements in functional tasks. Baseline: 61% Goal status: INITIAL  3.  Patient will increase left hip strength to at least 5-/5 to allow her to navigate community environment with improved ease. Baseline:  Goal status: INITIAL  4.  Patient to report ability to sit for at least an hour with no increase in pain to allow her decreased pain with upcoming travels. Baseline:  Goal status: INITIAL  5.  Patient will increase left single leg stance to at least 20 seconds to demonstrate improved balance. Baseline: 3.08 sec Goal status: met 6/4   PLAN:  PT FREQUENCY: 1-2x/week  PT DURATION: 8 weeks  PLANNED INTERVENTIONS: Therapeutic exercises, Therapeutic activity, Neuromuscular re-education, Balance training, Gait training, Patient/Family education, Self Care, Joint mobilization, Joint manipulation, Stair training, Aquatic Therapy, Dry Needling, Spinal manipulation, Spinal mobilization, Cryotherapy, Moist heat, Taping, Traction, Ultrasound, Ionotophoresis 4mg /ml Dexamethasone, Manual therapy, and  Re-evaluation.  PLAN FOR NEXT SESSION: left glute medius strengthening; dry needling with ES/manual therapy as indicated; upcoming trip to [place]  Mazal Ebey, PT 08/17/22 6:44 PM Phone: [phone] Fax: [phone]

## 2022-08-24 ENCOUNTER — Ambulatory Visit: Payer: Federal, State, Local not specified - PPO | Admitting: Physical Therapy

## 2022-08-24 DIAGNOSIS — M6281 Muscle weakness (generalized): Secondary | ICD-10-CM

## 2022-08-24 DIAGNOSIS — M62830 Muscle spasm of back: Secondary | ICD-10-CM

## 2022-08-24 NOTE — Therapy (Signed)
OUTPATIENT PHYSICAL THERAPY PROGRESS NOTE   Patient Name: Joan Edwards MRN: [id number] DOB:[birth year], 62 y.o., female Today's Date: 08/24/2022  END OF SESSION:  PT End of Session - 08/24/22 0935     Visit Number 6    Date for PT Re-Evaluation 09/03/22    Authorization Type BC/BS PPO    PT Start Time 0934    PT Stop Time 1010    PT Time Calculation (min) 36 min    Activity Tolerance Patient tolerated treatment well             Past Medical History:  Diagnosis Date   BREAST MASS, LEFT 04/10/2007   Qualifier: Diagnosis of  By: Lovell Sheehan MD, Balinda Quails    Past Surgical History:  Procedure Laterality Date   BREAST CYST EXCISION     DILATION AND CURETTAGE OF UTERUS     tonsil     Patient Active Problem List   Diagnosis Date Noted   Other bursal cyst, left hand 07/02/2020   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   Biceps tendonitis on right 09/13/2019   GERD 07/03/2007   IBS 07/03/2007   ALLERGIC RHINITIS 04/10/2007   Disorder of bone and cartilage 04/10/2007    PCP: Georgann Housekeeper, MD  REFERRING PROVIDER: Judi Saa, DO  REFERRING DIAG: M53.3 (ICD-10-CM) - SI (sacroiliac) joint dysfunction  Rationale for Evaluation and Treatment: Rehabilitation  THERAPY DIAG:  Muscle weakness (generalized)  Muscle spasm of back  ONSET DATE: Recent discharge from PT on 05/11/2022  SUBJECTIVE:                                                                                                                                                                                           SUBJECTIVE STATEMENT: DN and weekly massages have really helped.     PERTINENT HISTORY:  Osteoporosis, left breast mass excision in 2009  PAIN:  Are you having pain? Yes: NPRS scale: 1-2/10 Pain location: SI joint and left low back radiating down hip/psoas Pain description: radiating, sharp Aggravating factors: gardening, sitting in a car/long-term sitting Relieving factors:  massage, dry needling in the past  PRECAUTIONS: None  WEIGHT BEARING RESTRICTIONS: No  FALLS:  Has patient fallen in last 6 months? 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  NEXT MD VISIT: Dr. Katrinka Blazing yesterday and will follow up 7/23.  OBJECTIVE:   DIAGNOSTIC FINDINGS:  No new imaging  PATIENT SURVEYS:  Eval:  FOTO 61% (projected 68% by visit 12)  SCREENING FOR RED FLAGS: Bowel or bladder incontinence: No Spinal tumors: No Cauda equina syndrome: No Compression fracture: No Abdominal aneurysm: No  COGNITION: Overall cognitive status: Within functional limits for tasks assessed     SENSATION: Sometimes reports a "nervy pain" sometimes down the left LE  MUSCLE LENGTH: Pt reports a tight psoas muscle  POSTURE: No Significant postural limitations  PALPATION: Tightness/spasms noted on left lumbar paraspinals  LUMBAR ROM:   WFL with some discomfort with end range motion  LOWER EXTREMITY ROM:     WFL  LOWER EXTREMITY MMT:    07/13/2022:  WFL except left hip strength grossly 4 to 4+/5 throughout  LUMBAR SPECIAL TESTS:  Slump test: Negative  FUNCTIONAL TESTS:  07/13/2022: 5 times sit to stand: 6.07 sec Single leg stance: Right- 30 sec, Left- 3.08 sec  6/4:  left 20 sec with lateral trunk lean  GAIT: Distance walked: >500 ft Assistive device utilized: None Level of assistance: Complete Independence Comments: Patient denies pain with ambulation "as long as I have the right shoes on".  Patient reports that if she is wearing non-supportive sandals, her back starts hurting  after 10 minutes.  TODAY'S TREATMENT:    DATE: 08/24/2022  Manual therapy: soft tissue mobilization to left QL, gluteals and bil lumbar paraspinals Trigger Point Dry-Needling  Treatment instructions: Expect mild to moderate muscle soreness. S/S of pneumothorax if dry needled over a lung field, and to seek immediate medical attention should they occur. Patient verbalized understanding of these instructions and education.  Patient Consent Given: Yes Education handout provided: Yes Muscles treated: left QL and gluteals in sidelying; prone bil lumbar multifidi Electrical stimulation performed: Yes  Parameters: 1.5 ma 8 min  80pps Treatment response/outcome: improved soft tissue mobility; dec tender points 2 min moist heat in supine     DATE: 08/17/2022  SLS Right/left Discussion of glute isometrics during the day for glute activation Discussion of exercises that target the gluteals Review of DEXA scan and improvements in BMD since on Fosamax, osteopenia in left femur and lumbar spine Manual therapy: soft tissue mobilization to left QL, gluteals and bil lumbar paraspinals Trigger Point Dry-Needling  Treatment instructions: Expect mild to moderate muscle soreness. S/S of pneumothorax if dry needled over a lung field, and to seek immediate medical attention should they occur. Patient verbalized understanding of these instructions and education.  Patient Consent Given: Yes Education handout provided: Yes Muscles treated: left QL and gluteals in sidelying; prone bil lumbar multifidi Electrical stimulation performed: Yes Parameters: 1.5 ma 5 min  80pps Treatment response/outcome: improved soft tissue mobility; dec tender points 2 min moist heat in supine     DATE: 08/10/2022  Review of status during trip to [facility] Discussed symptom response to changing side of the bed she sleeps on Manual therapy: soft tissue mobilization to left QL, gluteals and bil lumbar paraspinals Trigger Point  Dry-Needling  Treatment instructions: Expect mild to moderate muscle soreness. S/S of pneumothorax if dry needled over a lung field, and to seek immediate medical attention should they occur. Patient verbalized understanding of these instructions and education.  Patient Consent Given: Yes Education handout provided: Yes Muscles treated: left QL and gluteals in sidelying; prone bil lumbar multifidi Electrical stimulation performed: Yes Parameters: 1.5 ma 8 min  80pps Treatment  response/outcome: improved soft tissue mobility; dec tender points 2 min moist heat in supine    PATIENT EDUCATION:  Education details: Issued HEP and provided dry needling educated Person educated: Patient Education method: Programmer, multimedia, Facilities manager, and Handouts Education comprehension: verbalized understanding  HOME EXERCISE PROGRAM: Access Code: [email or web address] URL: [email or web address] Date: 07/13/2022 Prepared by: Clydie Braun Menke  Exercises - Bird Dog  - 1 x daily - 7 x weekly - 1 sets - 10 reps - Primal Push Up  - 1 x daily - 7 x weekly - 1 sets - 10 reps - Prone Hip Extension - Two Pillows  - 1 x daily - 7 x weekly - 1 sets - 10 reps - Quadruped Hip Abduction and External Rotation  - 1 x daily - 7 x weekly - 1 sets - 10 reps  ASSESSMENT:  CLINICAL IMPRESSION: Very sensitive in left lumbar multifidi today with DN combined with ES.  Modifications to treatment time and technique based on response.  Improved soft tissue mobility and decreased tender points post session. The patient was encouraged in regular performance of HEP post DN including soft tissue lengthening and strengthening exercises to enhance long term benefit.     OBJECTIVE IMPAIRMENTS: decreased balance, difficulty walking, decreased strength, increased muscle spasms, impaired flexibility, and pain.   ACTIVITY LIMITATIONS: carrying, lifting, and sitting  PARTICIPATION LIMITATIONS: driving and community activity  PERSONAL  FACTORS: Time since onset of injury/illness/exacerbation and 1 comorbidity: osteoporosis  are also affecting patient's functional outcome.   REHAB POTENTIAL: Good  CLINICAL DECISION MAKING: Stable/uncomplicated  EVALUATION COMPLEXITY: Low   GOALS: Goals reviewed with patient? Yes  SHORT TERM GOALS: Target date: 07/30/2022  Patient will be independent with initial HEP. Baseline: Goal status: met 5/15  2.  Patient will be able to perform left single leg stance for at least 7 seconds to decrease fall risk. Baseline: 3.08 sec Goal status: ongoing   LONG TERM GOALS: Target date: 09/03/2022  Patient will be independent with advanced HEP. Baseline:  Goal status: INITIAL  2.  Patient will increase FOTO to at least 68% to demonstrate improvements in functional tasks. Baseline: 61% Goal status: INITIAL  3.  Patient will increase left hip strength to at least 5-/5 to allow her to navigate community environment with improved ease. Baseline:  Goal status: INITIAL  4.  Patient to report ability to sit for at least an hour with no increase in pain to allow her decreased pain with upcoming travels. Baseline:  Goal status: INITIAL  5.  Patient will increase left single leg stance to at least 20 seconds to demonstrate improved balance. Baseline: 3.08 sec Goal status: met 6/4   PLAN:  PT FREQUENCY: 1-2x/week  PT DURATION: 8 weeks  PLANNED INTERVENTIONS: Therapeutic exercises, Therapeutic activity, Neuromuscular re-education, Balance training, Gait training, Patient/Family education, Self Care, Joint mobilization, Joint manipulation, Stair training, Aquatic Therapy, Dry Needling, Spinal manipulation, Spinal mobilization, Cryotherapy, Moist heat, Taping, Traction, Ultrasound, Ionotophoresis 4mg /ml Dexamethasone, Manual therapy, and Re-evaluation.  PLAN FOR NEXT SESSION: left glute medius strengthening; dry needling with ES/manual therapy as indicated; upcoming trip to [place]  Kathyleen Radice, PT 08/24/22 4:45 PM Phone: [phone] Fax: [phone]

## 2022-08-26 ENCOUNTER — Encounter: Payer: Federal, State, Local not specified - PPO | Admitting: Physical Therapy

## 2022-08-26 ENCOUNTER — Ambulatory Visit: Payer: Federal, State, Local not specified - PPO | Admitting: Physical Therapy

## 2022-08-31 ENCOUNTER — Ambulatory Visit: Payer: Federal, State, Local not specified - PPO | Admitting: Physical Therapy

## 2022-08-31 DIAGNOSIS — M6281 Muscle weakness (generalized): Secondary | ICD-10-CM

## 2022-08-31 DIAGNOSIS — M62830 Muscle spasm of back: Secondary | ICD-10-CM

## 2022-08-31 NOTE — Therapy (Addendum)
OUTPATIENT PHYSICAL THERAPY PROGRESS NOTE/RECERTIFICATION/DISCHARGE SUMMARY   Patient Name: Joan Edwards MRN: [id number] DOB:[birth year], 62 y.o., female Today's Date: 08/31/2022  END OF SESSION:  PT End of Session - 08/31/22 0844     Visit Number 7    Date for PT Re-Evaluation 10/26/22    Authorization Type BC/BS PPO    PT Start Time 0845    PT Stop Time 0928    PT Time Calculation (min) 43 min    Activity Tolerance Patient tolerated treatment well             Past Medical History:  Diagnosis Date   BREAST MASS, LEFT 04/10/2007   Qualifier: Diagnosis of  By: Lovell Sheehan MD, Balinda Quails    Past Surgical History:  Procedure Laterality Date   BREAST CYST EXCISION     DILATION AND CURETTAGE OF UTERUS     tonsil     Patient Active Problem List   Diagnosis Date Noted   Other bursal cyst, left hand 07/02/2020   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   Biceps tendonitis on right 09/13/2019   GERD 07/03/2007   IBS 07/03/2007   ALLERGIC RHINITIS 04/10/2007   Disorder of bone and cartilage 04/10/2007    PCP: Georgann Housekeeper, MD  REFERRING PROVIDER: Judi Saa, DO  REFERRING DIAG: M53.3 (ICD-10-CM) - SI (sacroiliac) joint dysfunction  Rationale for Evaluation and Treatment: Rehabilitation  THERAPY DIAG:  Muscle weakness (generalized)  Muscle spasm of back  ONSET DATE: Recent discharge from PT on 05/11/2022  SUBJECTIVE:                                                                                                                                                                                           SUBJECTIVE STATEMENT: 14 hour day moving son. My back ached but no sciatica. I was really pleased.  Sunday leaves for [place].  PERTINENT HISTORY:  Osteoporosis, left breast mass excision in 2009  PAIN:  Are you having pain? Yes: NPRS scale: 1-2/10 Pain location: SI joint and left low back radiating down hip/psoas Pain description: radiating,  sharp Aggravating factors: gardening, sitting in a car/long-term sitting Relieving factors: massage, dry needling in the past  PRECAUTIONS: None  WEIGHT BEARING RESTRICTIONS: No  FALLS:  Has patient fallen in last 6 months? 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  NEXT MD VISIT: Dr. Katrinka Blazing yesterday and will follow up 7/23.  OBJECTIVE:   DIAGNOSTIC FINDINGS:  No new imaging  PATIENT SURVEYS:  Eval:  FOTO 61% (projected 68% by visit 12) 6/18: 73% goal met taken out of FOTO  SCREENING FOR RED FLAGS: Bowel or bladder incontinence: No Spinal tumors: No Cauda equina syndrome: No Compression fracture: No Abdominal aneurysm: No  COGNITION: Overall cognitive status: Within functional limits for tasks assessed     SENSATION: Sometimes reports a "nervy pain" sometimes down the left LE  MUSCLE LENGTH: Pt reports a tight psoas muscle  POSTURE: No Significant postural limitations  PALPATION: Tightness/spasms noted on left lumbar paraspinals  LUMBAR ROM:   WFL with some discomfort with end range motion  LOWER EXTREMITY ROM:     WFL  LOWER EXTREMITY MMT:    07/13/2022:  WFL except left hip strength grossly 4 to 4+/5 throughout 6/18:  left hip abduction 4/5; otherwise 5/5  LUMBAR SPECIAL TESTS:  Slump test: Negative  FUNCTIONAL TESTS:  07/13/2022: 5 times sit to stand: 6.07 sec Single leg stance: Right- 30 sec, Left- 3.08 sec  6/4:  left 20 sec with lateral trunk lean 6/18:  left   20 sec mild lateral trunk lean  GAIT: Distance walked: >500 ft Assistive device utilized:  None Level of assistance: Complete Independence Comments: Patient denies pain with ambulation "as long as I have the right shoes on".  Patient reports that if she is wearing non-supportive sandals, her back starts hurting after 10 minutes.  TODAY'S TREATMENT:    DATE: 08/31/2022  FOTO MMT SLS Discussion of new research on gluteal tendinopathy Manual therapy: soft tissue mobilization to left QL, gluteals and bil lumbar paraspinals Trigger Point Dry-Needling  Treatment instructions: Expect mild to moderate muscle soreness. S/S of pneumothorax if dry needled over a lung field, and to seek immediate medical attention should they occur. Patient verbalized understanding of these instructions and education.  Patient Consent Given: Yes Education handout provided: Yes Muscles treated: left QL and gluteals in sidelying; prone bil lumbar multifidi Electrical stimulation performed: no Parameters: n/a Treatment response/outcome: improved soft tissue mobility; dec tender points   DATE: 08/24/2022  Manual therapy: soft tissue mobilization to left QL, gluteals and bil lumbar paraspinals Trigger Point Dry-Needling  Treatment instructions: Expect mild to moderate muscle soreness. S/S of pneumothorax if dry needled over a lung field, and to seek immediate medical attention should they occur. Patient verbalized understanding of these instructions and education.  Patient Consent Given: Yes Education handout provided: Yes Muscles treated: left QL and gluteals in sidelying; prone bil lumbar multifidi Electrical stimulation performed: Yes  Parameters: 1.5 ma 8 min  80pps Treatment response/outcome: improved soft tissue mobility; dec tender points 2 min moist heat in supine     DATE: 08/17/2022  SLS Right/left Discussion of glute isometrics during the day for glute activation Discussion of exercises that target the gluteals Review of DEXA scan and improvements in BMD since on Fosamax, osteopenia in left  femur and lumbar spine Manual therapy: soft tissue mobilization to left QL, gluteals and bil lumbar paraspinals Trigger Point Dry-Needling  Treatment instructions: Expect mild to moderate muscle soreness. S/S of pneumothorax if dry needled over a lung field, and to seek immediate medical attention should they occur. Patient verbalized understanding of these instructions and education.  Patient Consent Given: Yes Education handout provided: Yes Muscles treated: left QL and gluteals in sidelying;  prone bil lumbar multifidi Electrical stimulation performed: Yes Parameters: 1.5 ma 5 min  80pps Treatment response/outcome: improved soft tissue mobility; dec tender points 2 min moist heat in supine     DATE: 08/10/2022  Review of status during trip to [facility] Discussed symptom response to changing side of the bed she sleeps on Manual therapy: soft tissue mobilization to left QL, gluteals and bil lumbar paraspinals Trigger Point Dry-Needling  Treatment instructions: Expect mild to moderate muscle soreness. S/S of pneumothorax if dry needled over a lung field, and to seek immediate medical attention should they occur. Patient verbalized understanding of these instructions and education.  Patient Consent Given: Yes Education handout provided: Yes Muscles treated: left QL and gluteals in sidelying; prone bil lumbar multifidi Electrical stimulation performed: Yes Parameters: 1.5 ma 8 min  80pps Treatment response/outcome: improved soft tissue mobility; dec tender points 2 min moist heat in supine    PATIENT EDUCATION:  Education details: Issued HEP and provided dry needling educated Person educated: Patient Education method: Programmer, multimedia, Facilities manager, and Handouts Education comprehension: verbalized understanding  HOME EXERCISE PROGRAM: Access Code: [email or web address] URL: [email or web address] Date: 07/13/2022 Prepared by: Clydie Braun Menke  Exercises - Bird Dog  - 1 x daily - 7 x  weekly - 1 sets - 10 reps - Primal Push Up  - 1 x daily - 7 x weekly - 1 sets - 10 reps - Prone Hip Extension - Two Pillows  - 1 x daily - 7 x weekly - 1 sets - 10 reps - Quadruped Hip Abduction and External Rotation  - 1 x daily - 7 x weekly - 1 sets - 10 reps  ASSESSMENT:  CLINICAL IMPRESSION: Patient has much improved functional outcome score meeting LTG.  Single leg stance time and strength much improved but still impaired compared to other side with left hip abductors 4/5.  Less sensitivity in lumbar multifidi today than last visit but several tender points in gluteals present.  Patient requests a follow up after her trip out of the country to address any issues that may arise.  Anticipate a tapering of visits and discharge to self management over the course of 8 weeks.     OBJECTIVE IMPAIRMENTS: decreased balance, difficulty walking, decreased strength, increased muscle spasms, impaired flexibility, and pain.   ACTIVITY LIMITATIONS: carrying, lifting, and sitting  PARTICIPATION LIMITATIONS: driving and community activity  PERSONAL FACTORS: Time since onset of injury/illness/exacerbation and 1 comorbidity: osteoporosis  are also affecting patient's functional outcome.   REHAB POTENTIAL: Good  CLINICAL DECISION MAKING: Stable/uncomplicated  EVALUATION COMPLEXITY: Low   GOALS: Goals reviewed with patient? Yes  SHORT TERM GOALS: Target date: 07/30/2022  Patient will be independent with initial HEP. Baseline: Goal status: met 5/15  2.  Patient will be able to perform left single leg stance for at least 7 seconds to decrease fall risk. Baseline: 3.08 sec Goal status:met 6/4  LONG TERM GOALS: Target date: 10/26/22  Patient will be independent with advanced HEP. Baseline:  Goal status: ongoing  2.  Patient will increase FOTO to at least 68% to demonstrate improvements in functional tasks. Baseline: 61% Goal status: met 6/18  3.  Patient will increase left hip strength to  at least 5-/5 to allow her to navigate community environment with improved ease. Baseline:  Goal status: ongoing  4.  Patient to report ability to sit for at least an hour with no increase in pain to allow her decreased pain with upcoming travels. Baseline:  Goal status:  ongoing 30  min to 1 hour partially met  5.  Patient will increase left single leg stance to at least 20 seconds to demonstrate improved balance. Baseline: 3.08 sec Goal status: met 6/4   PLAN:  PT FREQUENCY: 1x/week tapering  PT DURATION: 8 weeks  PLANNED INTERVENTIONS: Therapeutic exercises, Therapeutic activity, Neuromuscular re-education, Balance training, Gait training, Patient/Family education, Self Care, Joint mobilization, Joint manipulation, Stair training, Aquatic Therapy, Dry Needling, Spinal manipulation, Spinal mobilization, Cryotherapy, Moist heat, Taping, Traction, Ultrasound, Ionotophoresis 4mg /ml Dexamethasone, Manual therapy, and Re-evaluation.  PLAN FOR NEXT SESSION: see how trip to [place] went; left glute medius strengthening; dry needling with ES/manual therapy as indicated; sensitive to lumbar marinate  Lavinia Sharps, PT 08/31/22 1:25 PM Phone: [phone] Fax: [phone]   PHYSICAL THERAPY DISCHARGE SUMMARY  Visits from Start of Care: 7  Current functional level related to goals / functional outcomes: The pt called on 7/15 to request discharge from PT stating she was doing well (after her trip to [place]).  Will discharge from PT at this time.   Remaining deficits: As above   Education / Equipment: HEP   Patient agrees to discharge. Patient goals were met. Patient is being discharged due to meeting the stated rehab goals.  Lavinia Sharps, PT 09/28/22 9:22 AM Phone: [phone] Fax: [phone]

## 2022-09-02 ENCOUNTER — Encounter: Payer: Federal, State, Local not specified - PPO | Admitting: Physical Therapy

## 2022-09-28 ENCOUNTER — Ambulatory Visit: Payer: Federal, State, Local not specified - PPO | Admitting: Physical Therapy

## 2022-09-29 NOTE — Progress Notes (Signed)
  [name] [address] [place] [address] Phone: [phone] Subjective:   Bruce Donath, am serving as a scribe for Dr. Antoine Primas.  I'm seeing this patient by the request  of:  Georgann Housekeeper, MD  CC: Neck and back pain  [id number]  Joan Edwards is a 62 y.o. female coming in with complaint of back and neck pain. OMT on 08/16/2022. Patient states that she had very little back pain during her trip to [place]. She drove to [facility] and [place] this week and her back pain has increased. In June was trying to get massage and dry needling which was helpful in conjunction with one another.   Medications patient has been prescribed:   Taking:      Objective  Blood pressure 116/84, pulse 75, height 5\' 2"  (1.575 m), weight 147 lb (66.7 kg), SpO2 98%.   General: No apparent distress alert and oriented x3 mood and affect normal, dressed appropriately.  HEENT: Pupils equal, extraocular movements intact  Respiratory: Patient's speak in full sentences and does not appear short of breath  Cardiovascular: No lower extremity edema, non tender, no erythema  Neck exam does show some mild loss of lordosis but very minimal.  Mild discomfort noted over the right sacroiliac joint.  Osteopathic findings  C5 flexed rotated and side bent right T3 extended rotated and side bent right inhaled rib T5 extended rotated and side bent left L5 flexed rotated and side bent left Sacrum right on right       Assessment and Plan:  SI (sacroiliac) joint dysfunction Has been stable overall.  Discussed which activities to do and which ones to avoid.  Increase activity slowly.  Patient has done remarkably well with her trazodone.  Follow-up again in 3 months for further evaluation or sooner if any exacerbation of symptoms.    Nonallopathic problems  Decision today to treat with OMT was based on Physical Exam  After verbal consent patient was  treated with HVLA, ME, FPR techniques in cervical, rib, thoracic, lumbar, and sacral  areas  Patient tolerated the procedure well with improvement in symptoms  Patient given exercises, stretches and lifestyle modifications  See medications in patient instructions if given  Patient will follow up in 4-8 weeks             Note: This dictation was prepared with Dragon dictation along with smaller phrase technology. Any transcriptional errors that result from this process are unintentional.

## 2022-10-06 ENCOUNTER — Encounter: Payer: Self-pay | Admitting: Family Medicine

## 2022-10-06 ENCOUNTER — Ambulatory Visit: Payer: Federal, State, Local not specified - PPO | Admitting: Family Medicine

## 2022-10-06 VITALS — BP 116/84 | HR 75 | Ht 62.0 in | Wt 147.0 lb

## 2022-10-06 DIAGNOSIS — M533 Sacrococcygeal disorders, not elsewhere classified: Secondary | ICD-10-CM

## 2022-10-06 DIAGNOSIS — M9902 Segmental and somatic dysfunction of thoracic region: Secondary | ICD-10-CM

## 2022-10-06 DIAGNOSIS — M9904 Segmental and somatic dysfunction of sacral region: Secondary | ICD-10-CM | POA: Diagnosis not present

## 2022-10-06 DIAGNOSIS — M9903 Segmental and somatic dysfunction of lumbar region: Secondary | ICD-10-CM

## 2022-10-06 DIAGNOSIS — M9908 Segmental and somatic dysfunction of rib cage: Secondary | ICD-10-CM | POA: Diagnosis not present

## 2022-10-06 DIAGNOSIS — M9901 Segmental and somatic dysfunction of cervical region: Secondary | ICD-10-CM | POA: Diagnosis not present

## 2022-10-06 NOTE — Patient Instructions (Signed)
You are doing great See me in 3-4 months

## 2022-10-06 NOTE — Assessment & Plan Note (Signed)
Has been stable overall.  Discussed which activities to do and which ones to avoid.  Increase activity slowly.  Patient has done remarkably well with her trazodone.  Follow-up again in 3 months for further evaluation or sooner if any exacerbation of symptoms.

## 2022-10-07 ENCOUNTER — Ambulatory Visit: Payer: Federal, State, Local not specified - PPO | Admitting: Family Medicine

## 2022-10-07 ENCOUNTER — Encounter: Payer: Self-pay | Admitting: Family Medicine

## 2022-10-07 VITALS — BP 106/80 | HR 83 | Temp 97.0°F | Ht 62.0 in | Wt 149.0 lb

## 2022-10-07 DIAGNOSIS — K581 Irritable bowel syndrome with constipation: Secondary | ICD-10-CM | POA: Diagnosis not present

## 2022-10-07 DIAGNOSIS — E038 Other specified hypothyroidism: Secondary | ICD-10-CM | POA: Diagnosis not present

## 2022-10-07 DIAGNOSIS — J301 Allergic rhinitis due to pollen: Secondary | ICD-10-CM

## 2022-10-07 LAB — T3, FREE: T3, Free: 3.3 pg/mL (ref 2.3–4.2)

## 2022-10-07 LAB — T4, FREE: Free T4: 0.86 ng/dL (ref 0.60–1.60)

## 2022-10-07 LAB — TSH: TSH: 2.87 u[IU]/mL (ref 0.35–5.50)

## 2022-10-07 NOTE — Patient Instructions (Addendum)
Let us know when you get your COVID vaccine this fall or flu shot  Requesting pap records  Please stop by lab before you go If you have mychart- we will send your results within 3 business days of [place] receiving them.  If you do not have mychart- we will call you about results within 5 business days of [place] receiving them.  *please also note that you will see labs on mychart as soon as they post. I will later go in and write notes on them- will say "notes from Dr. Durene Cal"   Recommended follow up: Return in about 7 months (around 05/10/2023) for physical or sooner if needed.Schedule b4 you leave.

## 2022-10-07 NOTE — Progress Notes (Signed)
Phone: [phone]   Subjective:  Patient presents today to establish care.  Prior patient of Dr. Donette Larry 05/04/2022 Comprehensive Physical Exam (CPE) preventive care annual visit with [facility].  Chief Complaint  Patient presents with   Thyroid Problem    Wants to discuss thyroid and recent thyroid lab (records requested)   Ear Fullness    Pt c/o ear fullness since trip to [place].   Establish Care    Records have been requested from previous provider (has had recent CPE and PAP 04/2022)   See problem oriented charting  The following were reviewed and entered/updated in epic: Past Medical History:  Diagnosis Date   Allergic rhinitis 04/10/2007   BREAST MASS, LEFT 04/10/2007   Qualifier: Diagnosis of  By: Lovell Sheehan MD, John E    IBS 07/03/2007   Years ago with stomach sensitivity- better in 2024- fiber has been helpful         Osteoporosis 06/14/2019   Patient Active Problem List   Diagnosis Date Noted   Subclinical hypothyroidism 10/07/2022    Priority: Medium    Osteoporosis 06/14/2019    Priority: Medium    IBS 07/03/2007    Priority: Low   Allergic rhinitis 04/10/2007    Priority: Low   Other bursal cyst, left hand 07/02/2020    Priority: 1.   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   Biceps tendonitis on right 09/13/2019    Priority: 1.   Past Surgical History:  Procedure Laterality Date   BREAST CYST EXCISION     DILATION AND CURETTAGE OF UTERUS     x2. miscarriages   TONSILECTOMY/ADENOIDECTOMY WITH MYRINGOTOMY     4th grade    Family History  Problem Relation Age of Onset   Hypertension Mother        70 in 2024   Arthritis Mother    Hypertension Father        38 in 2024   Arthritis Father    Arthritis Sister    Healthy Brother    Healthy Brother    Lymphoma Maternal Grandmother    Hypertension Maternal Grandmother    Hypertension Maternal Grandfather    Heart failure Maternal Grandfather        lifelong smoker    Medications-  reviewed and updated Current Outpatient Medications  Medication Sig Dispense Refill   alendronate (FOSAMAX) 5 MG tablet Take 5 mg by mouth once a week. Take with a full glass of water on an empty stomach.     calcium gluconate 500 MG tablet Take 1 tablet by mouth daily.     cholecalciferol (VITAMIN D) 1000 UNITS tablet Take 1,000 Units by mouth daily.     gabapentin (NEURONTIN) 100 MG capsule Take 2 capsules (200 mg total) by mouth at bedtime. 180 capsule 0   glucosamine-chondroitin 500-400 MG tablet Take 1 tablet by mouth daily.     loratadine (CLARITIN) 10 MG tablet Take 10 mg by mouth daily.     MAGNESIUM PO Take by mouth.     Multiple Vitamin (MULTIVITAMIN) capsule Take 1 capsule by mouth daily.     Omega-3 Fatty Acids (FISH OIL PO) Take by mouth daily.     pseudoephedrine (SUDAFED) 30 MG tablet Take 30 mg by mouth every 4 (four) hours as needed for congestion.     triamcinolone (KENALOG) 0.025 % ointment APPLY TO AFFECTED AREA TWICE A DAY 30 g 0   TURMERIC PO Take by mouth daily.     Vitamin D,  Ergocalciferol, (DRISDOL) 1.25 MG (50000 UNIT) CAPS capsule TAKE 1 CAPSULE (50,000 UNITS TOTAL) BY MOUTH EVERY 7 (SEVEN) DAYS. 12 capsule 0   famciclovir (FAMVIR) 250 MG tablet TAKE ONE TABLET BY MOUTH TWICE DAILY FOR 5 DAYS AS NEEDED (Patient not taking: Reported on 10/07/2022) 60 tablet 0   No current facility-administered medications for this visit.    Allergies-reviewed and updated Allergies  Allergen Reactions   Erythromycin     REACTION: Upset GI   Other     Bananas-hives   Polymyxin B-Trimethoprim Itching   Sulfonamide Derivatives     REACTION: itching    Social History   Social History Narrative   Home Situation: lives with husband (married 20 years in 2024) . Also has a dog in 2024   - daughter, son -[place] ("line") graduated from [employer] and and son ([employer] grad and going to [employer]) school)      Work or School:    -tutoring in 2024   -prior Manufacturing systems engineer - full time       Lifestyle: yoga 3x per week - more in the summer, walks a few days per week    Objective  Objective:  BP 106/80   Pulse 83   Temp (!) 97 F (36.1 C)   Ht 5\' 2"  (1.575 m)   Wt 149 lb (67.6 kg)   SpO2 95%   BMI 27.25 kg/m  Gen: NAD, resting comfortably HEENT: Mucous membranes are moist. Oropharynx normal. TM normal.  No air-fluid level but still with possible otitis media with effusion Eyes: sclera and lids normal, PERRLA Neck: no obvious thyromegaly, no cervical lymphadenopathy CV: RRR no murmurs rubs or gallops Lungs: CTAB no crackles, wheeze, rhonchi Abdomen: soft/nontender/nondistended/normal bowel sounds. No rebound or guarding.  Ext: no edema Skin: warm, dry Neuro: Grossly normal, normal gait   Assessment and Plan:   #subclinical hypothyroidism S: compliant On thyroid medication- takes a thyroid support pill (iodine, ashwagandha, left- tyroxel 2x a day) - had started taking this after having levels that were off TSH on 05/04/21 of 4.36 but up to 4.81 with repeat in 2 months up to 5.71 and was told to stop biotin and thinks she was on this previously. Free T4 normal 0.78. mildly trending up over time -cold natured at baseline entire life no recent changes A/P:subclinical hypothyroidism with TSH increasing on recent labs- likely hold off on treatment unless gets above 10- we will order TSH, free t3 and free t4 today to monitor.    # Osteoporosis S: Last DEXA: 10/30/2021 with DEXA of -2.5 AP spine improved from -2.8, left femur improved form -2.0 to -1.7.   Medication (bisphosphonate or prolia): fosamax 70 mg weekly started at least 2022 if not 2021- tolerates well- may want to continue long term  Calcium: 1200mg  (through diet ok) recommended - takes Vitamin D: 1000 units a day recommended- takes and levels adequate A/P: improved on recent DEXA- continue to monitor at least every 2 years- continue current medications as they have been effective   #  Hyperglycemia/insulin resistance/prediabetes- peak a1c of 5.7 02/19/2019 S:  Medication: none  A/P: reviewed recent labs and a1c under 5.7- she had made some dietary adjustments since 2020 and helpful  #MSk concerns- works with Dr. Katrinka Blazing  in 2024 for SI joint pain -gabapentin rarely at night with Dr. Katrinka Blazing  usually just 100 mg for SI joint- but mainly dry needling and massage most helpful  #Genital herpes- sees gynecology and given this famciclovir 125 mg 2x  a day just as needed with flares  #Ear fullness S:was in [place] 4 weeks ago and had cold/congestion then flew 3 weeks ago and has felt stopped up since that time. Under water feeling. Perhaps mild change in hearing. Has tried rubbing alcohol without relief. Has taken some claritin every other day A/P: Possible mild otitis media with effusion-recommended Flonase for up to a month and can refer to ENT if needed-she is also considering adding lymphatic drainage after about 2 weeks-that would be low risk  #Health maintenance -TSH issues above plus other lab review- no hyperlipidemia, very mild low red blood cell(s) but other cell lines normal -2nd shingrix at [facility] in [place] -05/13/2014 colonoscopy  with Dr. Loreta Ave  on file with 10 year repeat Immunization History  Administered Date(s) Administered   Influenza Inj Mdck Quad Pf 12/18/2017   Influenza Whole 01/13/2005   Influenza-Unspecified 12/10/2013   PFIZER Comirnaty(Gray Top)Covid-19 Tri-Sucrose Vaccine 05/10/2019, 12/05/2021, 12/30/2021   PFIZER(Purple Top)SARS-COV-2 Vaccination 02/14/2020, 02/29/2020, 12/05/2020   Td 03/15/1993, 04/10/2007, 10/18/2017   Td (Adult),5 Lf Tetanus Toxid, Preservative Free 03/15/1993, 04/10/2007, 10/18/2017   Zoster Recombinant(Shingrix) 09/14/2019, 01/16/2020     Recommended follow up: Return in about 7 months (around 05/10/2023) for physical or sooner if needed.Schedule b4 you leave. Future Appointments  Date Time Provider Department Center  02/01/2023   9:00 AM Judi Saa, DO [facility] None   Time Spent: 50 minutes of total time (1:10 pm- 2 pm) was spent on the date of the encounter performing the following actions: chart review prior to seeing the patient and reviewing alongside the patient as we looked through her [place] records, obtaining history and updating the chart, performing a medically necessary exam, counseling on the needed workup including repeat thyroid testing as well as counseling about concerning thyroid levels or symptoms, placing orders, and documenting in our EHR.   Return precautions advised. Tana Conch, MD

## 2022-10-29 ENCOUNTER — Other Ambulatory Visit: Payer: Self-pay | Admitting: Family Medicine

## 2022-10-29 DIAGNOSIS — Z1231 Encounter for screening mammogram for malignant neoplasm of breast: Secondary | ICD-10-CM

## 2022-11-23 ENCOUNTER — Encounter: Payer: Self-pay | Admitting: Family Medicine

## 2022-12-20 ENCOUNTER — Ambulatory Visit
Admission: RE | Admit: 2022-12-20 | Discharge: 2022-12-20 | Disposition: A | Discharge status: Home / Self Care | Payer: Federal, State, Local not specified - PPO | Source: Ambulatory Visit | Attending: Family Medicine | Admitting: Family Medicine

## 2022-12-20 DIAGNOSIS — Z1231 Encounter for screening mammogram for malignant neoplasm of breast: Secondary | ICD-10-CM

## 2023-01-31 NOTE — Progress Notes (Unsigned)
[facility] [address] [place] [address] Phone: [phone] Subjective:   INadine Counts, am serving as a scribe for Dr. Antoine Primas.  I'm seeing this patient by the request  of:  Joan Majestic, MD  CC: Low back pain follow-up  TDV:[id number]  Joan [facility] is a 62 y.o. female coming in with complaint of back and neck pain. OMT 10/06/2022. Patient states following up. Massage and yoga help. Doesn't feel that MSK made much of a difference. Wondering what the cause of the issues are in that SI area.  Medications patient has been prescribed: None          Reviewed prior external information including notes and imaging from previsou exam, outside providers and external EMR if available.   As well as notes that were available from care everywhere and other healthcare systems.  Past medical history, social, surgical and family history all reviewed in electronic medical record.  No pertanent information unless stated regarding to the chief complaint.   Past Medical History:  Diagnosis Date   Allergic rhinitis 04/10/2007   BREAST MASS, LEFT 04/10/2007   Qualifier: Diagnosis of  By: Lovell Sheehan MD, Balinda Quails    IBS 07/03/2007   Years ago with stomach sensitivity- better in 2024- fiber has been helpful         Osteoporosis 06/14/2019    Allergies  Allergen Reactions   Erythromycin     REACTION: Upset GI   Other     Bananas-hives   Polymyxin B-Trimethoprim Itching   Sulfonamide Derivatives     REACTION: itching     Review of Systems:  No headache, visual changes, nausea, vomiting, diarrhea, constipation, dizziness, abdominal pain, skin rash, fevers, chills, night sweats, weight loss, swollen lymph nodes, body aches, joint swelling, chest pain, shortness of breath, mood changes. POSITIVE muscle aches  Objective  Blood pressure 120/82, pulse 69, height 5\' 2"  (1.575 m), weight 147 lb (66.7 kg), SpO2 95%.   General: No apparent  distress alert and oriented x3 mood and affect normal, dressed appropriately.  HEENT: Pupils equal, extraocular movements intact  Respiratory: Patient's speak in full sentences and does not appear short of breath  Cardiovascular: No lower extremity edema, non tender, no erythema  Gait MSK:  Back does have some loss lordosis noted.  Some tenderness to palpation in the paraspinal musculature.  Sitting relatively comfortably no otherwise.  Does have some scoliosis at baseline     Assessment and Plan:  SI (sacroiliac) joint dysfunction Has not been responding as well to the osteopathic manipulation.  We discussed advanced imaging the patient would like to hold on this at the moment.  Feels like it would not change medical management significantly.  We did discuss though about laboratory workup to see if anything else would be potentially contributing to some of the discomfort and pain or increasing fatigue.  Patient is in agreement with this plan.  Discussed which activities to do and which ones to avoid.  He is slowly otherwise follow-up with me again in 6 to 8 weeks.  No changes in medication at the moment.  Osteoporosis Patient has been told by the dentist to potentially consider the different medication instead of bisphosphonates.  May consider referral to endocrinology but patient will talk with primary care provider and will discuss      The above documentation has been reviewed and is accurate and complete Judi Saa, DO  Note: This dictation was prepared with Dragon dictation along with smaller phrase technology. Any transcriptional errors that result from this process are unintentional.

## 2023-02-01 ENCOUNTER — Encounter: Payer: Self-pay | Admitting: Family Medicine

## 2023-02-01 ENCOUNTER — Ambulatory Visit: Payer: Federal, State, Local not specified - PPO | Admitting: Family Medicine

## 2023-02-01 VITALS — BP 120/82 | HR 69 | Ht 62.0 in | Wt 147.0 lb

## 2023-02-01 DIAGNOSIS — M810 Age-related osteoporosis without current pathological fracture: Secondary | ICD-10-CM | POA: Diagnosis not present

## 2023-02-01 DIAGNOSIS — M533 Sacrococcygeal disorders, not elsewhere classified: Secondary | ICD-10-CM | POA: Diagnosis not present

## 2023-02-01 DIAGNOSIS — M2550 Pain in unspecified joint: Secondary | ICD-10-CM | POA: Diagnosis not present

## 2023-02-01 LAB — COMPREHENSIVE METABOLIC PANEL
ALT: 14 U/L (ref 0–35)
AST: 20 U/L (ref 0–37)
Albumin: 4.7 g/dL (ref 3.5–5.2)
Alkaline Phosphatase: 64 U/L (ref 39–117)
BUN: 16 mg/dL (ref 6–23)
CO2: 27 meq/L (ref 19–32)
Calcium: 9.8 mg/dL (ref 8.4–10.5)
Chloride: 102 meq/L (ref 96–112)
Creatinine, Ser: 0.81 mg/dL (ref 0.40–1.20)
GFR: 76.57 mL/min (ref >60.00)
Glucose, Bld: 99 mg/dL (ref 70–99)
Potassium: 4.4 meq/L (ref 3.5–5.1)
Sodium: 139 meq/L (ref 135–145)
Total Bilirubin: 0.4 mg/dL (ref 0.2–1.2)
Total Protein: 7.5 g/dL (ref 6.0–8.3)

## 2023-02-01 LAB — CBC WITH DIFFERENTIAL/PLATELET
Basophils Absolute: 0 10*3/uL (ref 0.0–0.1)
Basophils Relative: 1 % (ref 0.0–3.0)
Eosinophils Absolute: 0.4 10*3/uL (ref 0.0–0.7)
Eosinophils Relative: 9.4 % — ABNORMAL HIGH (ref 0.0–5.0)
HCT: 41.8 % (ref 36.0–46.0)
Hemoglobin: 13.9 g/dL (ref 12.0–15.0)
Lymphocytes Relative: 42.9 % (ref 12.0–46.0)
Lymphs Abs: 2 10*3/uL (ref 0.7–4.0)
MCHC: 33.2 g/dL (ref 30.0–36.0)
MCV: 97.1 fL (ref 78.0–100.0)
Monocytes Absolute: 0.2 10*3/uL (ref 0.1–1.0)
Monocytes Relative: 4.5 % (ref 3.0–12.0)
Neutro Abs: 1.9 10*3/uL (ref 1.4–7.7)
Neutrophils Relative %: 42.2 % — ABNORMAL LOW (ref 43.0–77.0)
Platelets: 345 10*3/uL (ref 150.0–400.0)
RBC: 4.3 Mil/uL (ref 3.87–5.11)
RDW: 12.4 % (ref 11.5–15.5)
WBC: 4.6 10*3/uL (ref 4.0–10.5)

## 2023-02-01 LAB — IBC PANEL
Iron: 86 ug/dL (ref 42–145)
Saturation Ratios: 21.3 % (ref 20.0–50.0)
TIBC: 404.6 ug/dL (ref 250.0–450.0)
Transferrin: 289 mg/dL (ref 212.0–360.0)

## 2023-02-01 LAB — C-REACTIVE PROTEIN: CRP: <1 mg/dL (ref 0.5–20.0)

## 2023-02-01 LAB — VITAMIN B12: Vitamin B-12: 334 pg/mL (ref 211–911)

## 2023-02-01 LAB — SEDIMENTATION RATE: Sed Rate: 18 mm/h (ref 0–30)

## 2023-02-01 LAB — FERRITIN: Ferritin: 24.2 ng/mL (ref 10.0–291.0)

## 2023-02-01 LAB — URIC ACID: Uric Acid, Serum: 4.1 mg/dL (ref 2.4–7.0)

## 2023-02-01 NOTE — Patient Instructions (Addendum)
Labs today Hold on MRI but this could be another step Continue on posture and ergonomics throughout the day See you again in 3 months

## 2023-02-01 NOTE — Assessment & Plan Note (Signed)
Has not been responding as well to the osteopathic manipulation.  We discussed advanced imaging the patient would like to hold on this at the moment.  Feels like it would not change medical management significantly.  We did discuss though about laboratory workup to see if anything else would be potentially contributing to some of the discomfort and pain or increasing fatigue.  Patient is in agreement with this plan.  Discussed which activities to do and which ones to avoid.  He is slowly otherwise follow-up with me again in 6 to 8 weeks.  No changes in medication at the moment.

## 2023-02-01 NOTE — Assessment & Plan Note (Signed)
Patient has been told by the dentist to potentially consider the different medication instead of bisphosphonates.  May consider referral to endocrinology but patient will talk with primary care provider and will discuss

## 2023-02-03 ENCOUNTER — Encounter: Payer: Self-pay | Admitting: Family Medicine

## 2023-02-03 LAB — RHEUMATOID FACTOR: Rheumatoid fact SerPl-aCnc: 11 [IU]/mL (ref <14)

## 2023-02-03 LAB — PTH, INTACT AND CALCIUM
Calcium: 10.1 mg/dL (ref 8.6–10.4)
PTH: 32 pg/mL (ref 16–77)

## 2023-02-03 LAB — ANA: Anti Nuclear Antibody (ANA): POSITIVE — AB

## 2023-02-03 LAB — CYCLIC CITRUL PEPTIDE ANTIBODY, IGG: Cyclic Citrullin Peptide Ab: <16 U

## 2023-02-03 LAB — CALCIUM, IONIZED: Calcium, Ion: 5.2 mg/dL (ref 4.7–5.5)

## 2023-02-03 LAB — ANTI-NUCLEAR AB-TITER (ANA TITER): ANA Titer 1: 1:40 {titer} — ABNORMAL HIGH

## 2023-02-03 LAB — ANGIOTENSIN CONVERTING ENZYME: Angiotensin-Converting Enzyme: 33 U/L (ref 9–67)

## 2023-02-04 ENCOUNTER — Encounter: Payer: Self-pay | Admitting: Family Medicine

## 2023-05-03 ENCOUNTER — Ambulatory Visit: Payer: Federal, State, Local not specified - PPO | Admitting: Family Medicine

## 2023-05-10 ENCOUNTER — Ambulatory Visit (INDEPENDENT_AMBULATORY_CARE_PROVIDER_SITE_OTHER): Payer: Federal, State, Local not specified - PPO | Admitting: Family Medicine

## 2023-05-10 ENCOUNTER — Encounter: Payer: Self-pay | Admitting: Family Medicine

## 2023-05-10 ENCOUNTER — Other Ambulatory Visit: Payer: Self-pay | Admitting: Family Medicine

## 2023-05-10 VITALS — BP 120/80 | HR 72 | Temp 97.8°F | Ht 62.0 in | Wt 140.6 lb

## 2023-05-10 DIAGNOSIS — R739 Hyperglycemia, unspecified: Secondary | ICD-10-CM

## 2023-05-10 DIAGNOSIS — Z131 Encounter for screening for diabetes mellitus: Secondary | ICD-10-CM | POA: Diagnosis not present

## 2023-05-10 DIAGNOSIS — Z13220 Encounter for screening for lipoid disorders: Secondary | ICD-10-CM

## 2023-05-10 DIAGNOSIS — M810 Age-related osteoporosis without current pathological fracture: Secondary | ICD-10-CM | POA: Diagnosis not present

## 2023-05-10 DIAGNOSIS — Z0000 Encounter for general adult medical examination without abnormal findings: Secondary | ICD-10-CM | POA: Diagnosis not present

## 2023-05-10 DIAGNOSIS — E038 Other specified hypothyroidism: Secondary | ICD-10-CM

## 2023-05-10 DIAGNOSIS — Z130 Encounter for screening for diseases of the blood and blood-forming organs and certain disorders involving the immune mechanism: Secondary | ICD-10-CM

## 2023-05-10 DIAGNOSIS — Z789 Other specified health status: Secondary | ICD-10-CM | POA: Diagnosis not present

## 2023-05-10 LAB — VITAMIN D 25 HYDROXY (VIT D DEFICIENCY, FRACTURES): VITD: 57.47 ng/mL (ref 30.00–100.00)

## 2023-05-10 LAB — COMPREHENSIVE METABOLIC PANEL
ALT: 14 U/L (ref 0–35)
AST: 18 U/L (ref 0–37)
Albumin: 4.4 g/dL (ref 3.5–5.2)
Alkaline Phosphatase: 62 U/L (ref 39–117)
BUN: 13 mg/dL (ref 6–23)
CO2: 27 meq/L (ref 19–32)
Calcium: 9.3 mg/dL (ref 8.4–10.5)
Chloride: 105 meq/L (ref 96–112)
Creatinine, Ser: 0.83 mg/dL (ref 0.40–1.20)
GFR: 74.22 mL/min (ref >60.00)
Glucose, Bld: 96 mg/dL (ref 70–99)
Potassium: 4.3 meq/L (ref 3.5–5.1)
Sodium: 140 meq/L (ref 135–145)
Total Bilirubin: 0.4 mg/dL (ref 0.2–1.2)
Total Protein: 6.9 g/dL (ref 6.0–8.3)

## 2023-05-10 LAB — HEMOGLOBIN A1C: Hgb A1c MFr Bld: 5.6 % (ref 4.6–6.5)

## 2023-05-10 LAB — CBC WITH DIFFERENTIAL/PLATELET
Basophils Absolute: 0 10*3/uL (ref 0.0–0.1)
Basophils Relative: 1 % (ref 0.0–3.0)
Eosinophils Absolute: 0.2 10*3/uL (ref 0.0–0.7)
Eosinophils Relative: 5.9 % — ABNORMAL HIGH (ref 0.0–5.0)
HCT: 39.4 % (ref 36.0–46.0)
Hemoglobin: 13 g/dL (ref 12.0–15.0)
Lymphocytes Relative: 42.3 % (ref 12.0–46.0)
Lymphs Abs: 1.8 10*3/uL (ref 0.7–4.0)
MCHC: 33.1 g/dL (ref 30.0–36.0)
MCV: 97.4 fl (ref 78.0–100.0)
Monocytes Absolute: 0.2 10*3/uL (ref 0.1–1.0)
Monocytes Relative: 5.9 % (ref 3.0–12.0)
Neutro Abs: 1.9 10*3/uL (ref 1.4–7.7)
Neutrophils Relative %: 44.9 % (ref 43.0–77.0)
Platelets: 325 10*3/uL (ref 150.0–400.0)
RBC: 4.04 Mil/uL (ref 3.87–5.11)
RDW: 12.5 % (ref 11.5–15.5)
WBC: 4.2 10*3/uL (ref 4.0–10.5)

## 2023-05-10 LAB — LIPID PANEL
Cholesterol: 155 mg/dL (ref 0–200)
HDL: 89.7 mg/dL (ref >39.00)
LDL Cholesterol: 51 mg/dL (ref 0–99)
NonHDL: 64.94
Total CHOL/HDL Ratio: 2
Triglycerides: 68 mg/dL (ref 0.0–149.0)
VLDL: 13.6 mg/dL (ref 0.0–40.0)

## 2023-05-10 LAB — TSH: TSH: 3.03 u[IU]/mL (ref 0.35–5.50)

## 2023-05-10 LAB — T3, FREE: T3, Free: 3.3 pg/mL (ref 2.3–4.2)

## 2023-05-10 LAB — T4, FREE: Free T4: 0.81 ng/dL (ref 0.60–1.60)

## 2023-05-10 NOTE — Patient Instructions (Addendum)
 Health Maintenance Due  Topic Date Due   Cervical Cancer Screening (HPV/Pap Cotest)  10/18/2020  Team please request copy of pap smear- from Dr. Billy Coast   Reach out when get clear for fosamax from oral surgeon  Call breast center to schedule bone density after August 18th of this year  Please stop by lab before you go If you have mychart- we will send your results within 3 business days of [place] receiving them.  If you do not have mychart- we will call you about results within 5 business days of [place] receiving them.  *please also note that you will see labs on mychart as soon as they post. I will later go in and write notes on them- will say "notes from Dr. Durene Cal"   Recommended follow up: Return in about 1 year (around 05/09/2024) for physical or sooner if needed.Schedule b4 you leave.

## 2023-05-10 NOTE — Progress Notes (Signed)
 Phone [phone]   Subjective:  Patient presents today for their annual physical. Chief complaint-noted.   See problem oriented charting- ROS- full  review of systems was completed and negative except for: si joint pain, low grade nausea at times  The following were reviewed and entered/updated in epic: Past Medical History:  Diagnosis Date   Allergic rhinitis 04/10/2007   BREAST MASS, LEFT 04/10/2007   Qualifier: Diagnosis of  By: Lovell Sheehan MD, John E    IBS 07/03/2007   Years ago with stomach sensitivity- better in 2024- fiber has been helpful         Osteoporosis 06/14/2019   Patient Active Problem List   Diagnosis Date Noted   Subclinical hypothyroidism 10/07/2022    Priority: Medium    Osteoporosis 06/14/2019    Priority: Medium    IBS 07/03/2007    Priority: Low   Allergic rhinitis 04/10/2007    Priority: Low   Other bursal cyst, left hand 07/02/2020    Priority: 1.   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   Biceps tendonitis on right 09/13/2019    Priority: 1.   Past Surgical History:  Procedure Laterality Date   BREAST CYST EXCISION     DILATION AND CURETTAGE OF UTERUS     x2. miscarriages   TONSILECTOMY/ADENOIDECTOMY WITH MYRINGOTOMY     4th grade    Family History  Problem Relation Age of Onset   Hypertension Mother        109 in 2024   Arthritis Mother    Hypertension Father        55 in 2024   Arthritis Father    Arthritis Sister    Healthy Brother    Healthy Brother    Lymphoma Maternal Grandmother    Hypertension Maternal Grandmother    Hypertension Maternal Grandfather    Heart failure Maternal Grandfather        lifelong smoker    Medications- reviewed and updated Current Outpatient Medications  Medication Sig Dispense Refill   CALCIUM GLUCONATE PO Take 1 tablet by mouth 2 (two) times daily. 600mg  twice a day     cholecalciferol (VITAMIN D) 1000 UNITS tablet Take 1,000 Units by mouth daily.     gabapentin  (NEURONTIN) 100 MG capsule Take 2 capsules (200 mg total) by mouth at bedtime. (Patient taking differently: Take 100 mg by mouth as needed.) 180 capsule 0   glucosamine-chondroitin 500-400 MG tablet Take 1 tablet by mouth daily.     loratadine (CLARITIN) 10 MG tablet Take 10 mg by mouth daily as needed.     MAGNESIUM PO Take by mouth. 2 tablets     Multiple Vitamin (MULTIVITAMIN) capsule Take 1 capsule by mouth. 3 times a week     nystatin ointment (MYCOSTATIN) As needed     Omega-3 Fatty Acids (FISH OIL PO) Take by mouth daily.     pseudoephedrine (SUDAFED) 30 MG tablet Take 30 mg by mouth every 4 (four) hours as needed for congestion.     triamcinolone (KENALOG) 0.025 % ointment APPLY TO AFFECTED AREA TWICE A DAY 30 g 0   TURMERIC PO Take by mouth daily.     famciclovir (FAMVIR) 250 MG tablet TAKE ONE TABLET BY MOUTH TWICE DAILY FOR 5 DAYS AS NEEDED (Patient not taking: Reported on 05/10/2023) 60 tablet 0   Vitamin D, Ergocalciferol, (DRISDOL) 1.25 MG (50000 UNIT) CAPS capsule TAKE 1 CAPSULE (50,000 UNITS TOTAL) BY MOUTH EVERY 7 (SEVEN) DAYS. (Patient not taking:  Reported on 05/10/2023) 12 capsule 0   No current facility-administered medications for this visit.    Allergies-reviewed and updated Allergies  Allergen Reactions   Erythromycin     REACTION: Upset GI   Other     Bananas-hives   Polymyxin B-Trimethoprim Itching   Sulfonamide Derivatives     REACTION: itching    Social History   Social History Narrative   Home Situation: lives with husband (married 20 years in 2024) . Also has a dog in 2024   - daughter, son -[place] Citizen ("line") graduated from [employer] and and son ([employer] grad and going to [employer]) school)      Work or School:    -tutoring in 2024   -prior Manufacturing systems engineer - full time      Lifestyle: yoga 3x per week - more in the summer, walks a few days per week   Objective  Objective:  BP 120/80   Pulse 72   Temp 97.8 F (36.6 C)   Ht 5\' 2"  (1.575 m)   Wt 140  lb 9.6 oz (63.8 kg)   SpO2 100%   BMI 25.72 kg/m  Gen: NAD, resting comfortably HEENT: Mucous membranes are moist. Oropharynx normal Neck: no thyromegaly CV: RRR no murmurs rubs or gallops Lungs: CTAB no crackles, wheeze, rhonchi Abdomen: soft/nontender/nondistended/normal bowel sounds. No rebound or guarding.  Ext: no edema Skin: warm, dry Neuro: grossly normal, moves all extremities, PERRLA   Assessment and Plan   63 y.o. female presenting for annual physical.  Health Maintenance counseling: 1. Anticipatory guidance: Patient counseled regarding regular dental exams -q6 months- has had some more extensive work with oral surgeon lately- working through that, eye exams - yearly,  avoiding smoking and second hand smoke , limiting alcohol to 1 beverage per day- 3 oz a wine once a week right now- max 3-4 x a week 2 oz , no illicit drugs .   2. Risk factor reduction:  Advised patient of need for regular exercise and diet rich and fruits and vegetables to reduce risk of heart attack and stroke.  Exercise- yoga once a week and walking when not in winter.  Diet/weight management-weight watchers since January- down 6 lbs.  Wt Readings from Last 3 Encounters:  05/10/23 140 lb 9.6 oz (63.8 kg)  02/01/23 147 lb (66.7 kg)  10/07/22 149 lb (67.6 kg)  3. Immunizations/screenings/ancillary studies- wants to assess MMR immunity Immunization History  Administered Date(s) Administered   Influenza Inj Mdck Quad Pf 12/18/2017   Influenza Whole 01/13/2005   Influenza-Unspecified 12/10/2013, 12/08/2022   PFIZER Comirnaty(Gray Top)Covid-19 Tri-Sucrose Vaccine 05/10/2019, 12/05/2021, 12/30/2021   PFIZER(Purple Top)SARS-COV-2 Vaccination 02/14/2020, 02/29/2020, 12/05/2020   Td 03/15/1993, 04/10/2007, 10/18/2017   Td (Adult),5 Lf Tetanus Toxid, Preservative Free 03/15/1993, 04/10/2007, 10/18/2017   Zoster Recombinant(Shingrix) 09/14/2019, 01/16/2020   4. Cervical cancer screening- we are requesting  records again from GYN - sees Dr. Billy Coast but retiring. Considering Dr. Adalberto Ill. Wants holistic/functional perspective 5. Breast cancer screening-  breast exam with gyn and mammogram 12/20/22 6. Colon cancer screening - 05/13/2014 colonoscopy with Dr. Loreta Ave on file with 10 year repeat  7. Skin cancer screening- no regular dermatology - sees Dr. Karlyn Agee as needed. advised regular sunscreen use. Denies worrisome, changing, or new skin lesions.  8. Birth control/STD check- postmenopausal and monogomous 9. Osteoporosis screening at 65- see below 10. Smoking associated screening - former smoker - minimal pack years and quit over 25 years go  Status of chronic or acute concerns   #  subclinical hypothyroidism S: compliant On thyroid medication- none right now -previously on thyroid support pill (iodine, ashwagandha, left- tyroxel 2x a day) - had started taking this after having levels that were off TSH on 05/04/21 of 4.36 but up to 4.81 with repeat in 2 months up to 5.71 and was told to stop biotin and thinks she was on this previously. Free T4 normal 0.78. mildly trending up over time -cold natured at baseline entire life no recent changes A/P:subclinical hypothyroidism - check TSH, t3, t4  # Osteoporosis S: Last DEXA: 10/30/2021 with DEXA of -2.5 AP spine improved from -2.8, left femur improved form -2.0 to -1.7 wiith breast center   Medication (bisphosphonate or prolia): OFF of- fosamax 70 mg weekly- started at least 2022 if not 2021- tolerates well.   -had to be off 6 months from September 2024 to march 2025 due to dental work at least- still off right now  Calcium: 1200mg  (through diet ok) recommended - takes Vitamin D: 1000 units a day recommended- takes and levels adequate A/P: osteoporosis- once cleared she wants to restart fosamax then udpate bone density in August- she can reach out- would like to use breast center again    # Hyperglycemia/insulin resistance/prediabetes- peak a1c of 5.7  02/19/2019- check a1c with labs today  Lab Results  Component Value Date   HGBA1C 5.5 10/18/2017   HGBA1C 5.6 04/25/2015   MSk concerns- works with Dr. Katrinka Blazing  in 2024 for SI joint pain- ongoing chronic issues -gabapentin  if nerve flaredrarely at night with Dr. Katrinka Blazing  usually just 100 mg for SI joint- but mainly dry needling and massage most helpful -25 minute stretch routine in week and also does yoga.  -massage therapist tells her very tight in low back. May get opinion of chiropractor.  -also tries a topical with some relief  #screen hyperlipidemia- update lipid panel with labs  #Genital herpes- sees gynecology and given this faamciclovier 125 mg 2x a day just as needed with flares  Recommended follow up: Return in about 1 year (around 05/09/2024) for physical or sooner if needed.Schedule b4 you leave.  Lab/Order associations:NOT fasting   ICD-10-CM   1. Preventative health care  Z00.00     2. Measles, mumps, rubella (MMR) vaccination status unknown  Z78.9     3. Osteoporosis, unspecified osteoporosis type, unspecified pathological fracture presence  M81.0     4. Subclinical hypothyroidism  E03.8     5. Screening for hyperlipidemia  Z13.220     6. Screening for deficiency anemia  Z13.0     7. Hyperglycemia  R73.9     8. Screening for diabetes mellitus  Z13.1       No orders of the defined types were placed in this encounter.   Return precautions advised.  Tana Conch, MD

## 2023-05-11 LAB — MEASLES/MUMPS/RUBELLA IMMUNITY
Mumps IgG: <9 [AU]/ml — ABNORMAL LOW
Rubella: 10.1 {index}
Rubeola IgG: 85.1 [AU]/ml

## 2023-05-12 ENCOUNTER — Encounter: Payer: Self-pay | Admitting: Family Medicine

## 2023-05-18 ENCOUNTER — Telehealth: Payer: Self-pay | Admitting: Family Medicine

## 2023-05-18 NOTE — Telephone Encounter (Signed)
 Ok to schedule nurse visit. NV orders are not placed prior to visit.

## 2023-05-18 NOTE — Telephone Encounter (Unsigned)
 Copied from CRM [id number]. Topic: Appointments - Scheduling Inquiry for Clinic >> May 18, 2023 12:33 PM Adele Barthel wrote: Reason for CRM:   Patient is requesting MMR vaccine booster, recommended at last visit with provider. No orders in chart, and unable to schedule.   Requests call back to schedule once orders are placed.   CB# [phone]

## 2023-05-19 ENCOUNTER — Ambulatory Visit (INDEPENDENT_AMBULATORY_CARE_PROVIDER_SITE_OTHER)

## 2023-05-19 DIAGNOSIS — Z23 Encounter for immunization: Secondary | ICD-10-CM

## 2023-08-01 ENCOUNTER — Encounter: Payer: Self-pay | Admitting: Family Medicine

## 2023-08-02 MED ORDER — ALENDRONATE SODIUM 70 MG PO TABS
70.0000 mg | ORAL_TABLET | ORAL | 11 refills | Status: AC
Start: 2023-08-02 — End: ?

## 2023-08-25 ENCOUNTER — Encounter: Payer: Self-pay | Admitting: Family Medicine

## 2023-09-15 ENCOUNTER — Ambulatory Visit (INDEPENDENT_AMBULATORY_CARE_PROVIDER_SITE_OTHER): Admitting: Family Medicine

## 2023-09-15 ENCOUNTER — Encounter: Payer: Self-pay | Admitting: Family Medicine

## 2023-09-15 VITALS — BP 131/84 | HR 74 | Temp 98.2°F | Resp 16 | Ht 62.0 in | Wt 140.5 lb

## 2023-09-15 DIAGNOSIS — R21 Rash and other nonspecific skin eruption: Secondary | ICD-10-CM | POA: Diagnosis not present

## 2023-09-15 MED ORDER — CEPHALEXIN 500 MG PO CAPS: 500.0000 mg | ORAL_CAPSULE | Freq: Two times a day (BID) | ORAL | 0 refills | Status: AC

## 2023-09-15 NOTE — Progress Notes (Signed)
 Subjective:     Patient ID: [facility], female    DOB: 23-Dec-1960, 63 y.o.   MRN: [id number]  Chief Complaint  Patient presents with   Left Breast Issue    Area next to left nipple, that is pink, no pain or discharge, noticed yesterday    HPI Mamm 10/7 Discussed the use of AI scribe software for clinical note transcription with the patient, who gave verbal consent to proceed.  History of Present Illness Joan [facility] is a 63 year old female who presents with a new pink spot on her left breast.  She noticed the spot last night after removing her sports bra following a yoga session. The spot is pinkish, about the size of a nickel, and extends from the coloration of her nipple. Initially, she thought it might be related to the ridges on her sports bra or possibly a bug bite as she had been outside.  Overnight, while wearing a t-shirt and pajama pants, the spot appeared less pink. However, upon wearing a regular bra this morning, the spot became more pink again. No pain, itching, discharge, or trauma is associated with the spot.  She has a history of a lump in the left breast, which was removed and identified as a cyst. Her last mammogram in October was normal.    Health Maintenance Due  Topic Date Due   Medicare Annual Wellness (AWV)  Never done   Cervical Cancer Screening (HPV/Pap Cotest)  10/18/2020    Past Medical History:  Diagnosis Date   Allergic rhinitis 04/10/2007   Allergy 1990   ossasional seasonal   BREAST MASS, LEFT 04/10/2007   Qualifier: Diagnosis of  By: Mavis MD, John E    IBS 07/03/2007   Years ago with stomach sensitivity- better in 2024- fiber has been helpful         Osteoporosis 06/14/2019    Past Surgical History:  Procedure Laterality Date   BREAST CYST EXCISION     DILATION AND CURETTAGE OF UTERUS     x2. miscarriages   TONSILECTOMY/ADENOIDECTOMY WITH MYRINGOTOMY     4th grade     Current Outpatient Medications:     alendronate  (FOSAMAX ) 70 MG tablet, Take 1 tablet (70 mg total) by mouth every 7 (seven) days. Take with a full glass of water on an empty stomach., Disp: 4 tablet, Rfl: 11   CALCIUM GLUCONATE PO, Take 1 tablet by mouth 2 (two) times daily. 600mg  twice a day, Disp: , Rfl:    cephALEXin (KEFLEX) 500 MG capsule, Take 1 capsule (500 mg total) by mouth 2 (two) times daily for 7 days. Take for 7 days, Disp: 14 capsule, Rfl: 0   cholecalciferol (VITAMIN D ) 1000 UNITS tablet, Take 1,000 Units by mouth daily., Disp: , Rfl:    famciclovir  (FAMVIR ) 250 MG tablet, TAKE ONE TABLET BY MOUTH TWICE DAILY FOR 5 DAYS AS NEEDED, Disp: 60 tablet, Rfl: 0   gabapentin  (NEURONTIN ) 100 MG capsule, Take 2 capsules (200 mg total) by mouth at bedtime. (Patient taking differently: Take 100 mg by mouth as needed.), Disp: 180 capsule, Rfl: 0   glucosamine-chondroitin 500-400 MG tablet, Take 1 tablet by mouth daily., Disp: , Rfl:    loratadine (CLARITIN) 10 MG tablet, Take 10 mg by mouth daily as needed., Disp: , Rfl:    MAGNESIUM PO, Take by mouth. 2 tablets, Disp: , Rfl:    Multiple Vitamin (MULTIVITAMIN) capsule, Take 1 capsule by mouth. 3 times a week, Disp: ,  Rfl:    nystatin  ointment (MYCOSTATIN ), As needed, Disp: , Rfl:    Omega-3 Fatty Acids (FISH OIL PO), Take by mouth daily., Disp: , Rfl:    pseudoephedrine (SUDAFED) 30 MG tablet, Take 30 mg by mouth every 4 (four) hours as needed for congestion., Disp: , Rfl:    triamcinolone  (KENALOG ) 0.025 % ointment, APPLY TO AFFECTED AREA TWICE A DAY, Disp: 30 g, Rfl: 0   TURMERIC PO, Take by mouth daily., Disp: , Rfl:    Vitamin D , Ergocalciferol , (DRISDOL ) 1.25 MG (50000 UNIT) CAPS capsule, TAKE 1 CAPSULE (50,000 UNITS TOTAL) BY MOUTH EVERY 7 (SEVEN) DAYS., Disp: 12 capsule, Rfl: 0  Allergies  Allergen Reactions   Sulfonamide Derivatives Itching, Other (See Comments) and Dermatitis    REACTION: itching   Other Other (See Comments)    Bananas-hives   Polymyxin B Itching    Polymyxin B-Trimethoprim Itching   Erythromycin Itching, Nausea Only and Other (See Comments)    REACTION: Upset GI   ROS neg/noncontributory except as noted HPI/below      Objective:     BP 131/84   Pulse 74   Temp 98.2 F (36.8 C) (Temporal)   Resp 16   Ht 5' 2 (1.575 m)   Wt 140 lb 8 oz (63.7 kg)   SpO2 100%   BMI 25.70 kg/m  Wt Readings from Last 3 Encounters:  09/15/23 140 lb 8 oz (63.7 kg)  05/10/23 140 lb 9.6 oz (63.8 kg)  02/01/23 147 lb (66.7 kg)    Physical Exam   Gen: WDWN NAD HEENT: NCAT, conjunctiva not injected, sclera nonicteric EXT:  no edema MSK: no gross abnormalities.  NEURO: A&O x3.  CN II-XII intact.  PSYCH: normal mood. Good eye contact Breasts:  Right: breast appears normal without suspicious masses, no skin or nipple changes or axillary nodes.   Left: breast appears normal without suspicious masses, no nipple changes or axillary nodes.  Tiny area of slight pinkness just above areola.  Not warm/tender/fluctuant  Chaperone present  QJ       Assessment & Plan:  Rash  Other orders -     Cephalexin; Take 1 capsule (500 mg total) by mouth 2 (two) times daily for 7 days. Take for 7 days  Dispense: 14 capsule; Refill: 0  Assessment and Plan Assessment & Plan Left breast erythema   She presented with mild erythema on the left areola, noticed after wearing a sports bra. There is no pain, itching, discharge, or trauma. A normal mammogram in October is reassuring. Differential diagnosis includes irritation from clothing, insect bite, or early skin infection. The erythema's variation with different bras suggests irritation. Monitor for changes. Prescribe Keflex 500 mg bid if symptoms worsen to cover potential skin infections. Advise her to report any worsening. Consider mammogram and ultrasound if no improvement.  Benign left breast cyst   She has a history of a benign left breast cyst that was removed.    Return if symptoms worsen or fail to  improve.  Jenkins CHRISTELLA Carrel, MD

## 2023-09-15 NOTE — Patient Instructions (Signed)
 Antibiotics if needed Monitor  Let me know how doing next week

## 2023-09-19 ENCOUNTER — Encounter: Payer: Self-pay | Admitting: Family Medicine

## 2023-09-22 ENCOUNTER — Other Ambulatory Visit: Payer: Self-pay | Admitting: Family Medicine

## 2023-09-22 DIAGNOSIS — Z1231 Encounter for screening mammogram for malignant neoplasm of breast: Secondary | ICD-10-CM

## 2023-10-31 ENCOUNTER — Encounter: Payer: Self-pay | Admitting: Family Medicine

## 2023-10-31 DIAGNOSIS — N644 Mastodynia: Secondary | ICD-10-CM

## 2023-10-31 NOTE — Telephone Encounter (Signed)
 Please advise on need for appt.

## 2023-11-08 ENCOUNTER — Ambulatory Visit
Admission: RE | Admit: 2023-11-08 | Discharge: 2023-11-08 | Disposition: A | Discharge status: Home / Self Care | Source: Ambulatory Visit | Attending: Family Medicine | Admitting: Family Medicine

## 2023-11-08 ENCOUNTER — Ambulatory Visit

## 2023-11-08 DIAGNOSIS — N644 Mastodynia: Secondary | ICD-10-CM

## 2023-11-14 ENCOUNTER — Encounter: Payer: Self-pay | Admitting: Family Medicine

## 2023-11-15 LAB — HM DEXA SCAN

## 2023-11-17 ENCOUNTER — Ambulatory Visit: Payer: Self-pay | Admitting: Family Medicine

## 2023-11-17 ENCOUNTER — Other Ambulatory Visit: Payer: Self-pay | Admitting: Family Medicine

## 2023-11-17 MED ORDER — COVID-19 MRNA VACC (MODERNA) 50 MCG/0.5ML IM SUSP: 0.5000 mL | Freq: Once | INTRAMUSCULAR | 0 refills | Status: AC

## 2023-12-21 ENCOUNTER — Ambulatory Visit
Admission: RE | Admit: 2023-12-21 | Discharge: 2023-12-21 | Disposition: A | Discharge status: Home / Self Care | Source: Ambulatory Visit | Attending: Family Medicine | Admitting: Family Medicine

## 2023-12-21 DIAGNOSIS — Z1231 Encounter for screening mammogram for malignant neoplasm of breast: Secondary | ICD-10-CM

## 2024-01-04 ENCOUNTER — Other Ambulatory Visit: Payer: Federal, State, Local not specified - PPO

## 2024-05-11 ENCOUNTER — Encounter: Payer: Federal, State, Local not specified - PPO | Admitting: Family Medicine
